# Patient Record
Sex: Male | Born: 1995 | Race: Black or African American | Hispanic: No | Marital: Single | Smoking: Current every day smoker
Health system: Southern US, Community
[De-identification: ages and names within clinical notes are randomized; demographics above are authoritative.]

## PROBLEM LIST (undated history)

## (undated) DIAGNOSIS — J45909 Unspecified asthma, uncomplicated: Secondary | ICD-10-CM

---
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
## 2016-12-02 ENCOUNTER — Observation Stay
Admission: EM | Admit: 2016-12-02 | Discharge: 2016-12-03 | Disposition: A | Discharge status: Home / Self Care | Payer: Medicaid Other | Attending: Surgery | Admitting: Surgery

## 2016-12-02 ENCOUNTER — Encounter
Admission: EM | Disposition: A | Discharge status: Home / Self Care | Payer: Self-pay | Source: Home / Self Care | Attending: Emergency Medicine

## 2016-12-02 ENCOUNTER — Other Ambulatory Visit: Payer: Self-pay

## 2016-12-02 ENCOUNTER — Emergency Department: Payer: Medicaid Other

## 2016-12-02 ENCOUNTER — Observation Stay: Payer: Medicaid Other | Admitting: Anesthesiology

## 2016-12-02 ENCOUNTER — Encounter: Payer: Self-pay | Admitting: Emergency Medicine

## 2016-12-02 DIAGNOSIS — Z79899 Other long term (current) drug therapy: Secondary | ICD-10-CM | POA: Insufficient documentation

## 2016-12-02 DIAGNOSIS — J45909 Unspecified asthma, uncomplicated: Secondary | ICD-10-CM | POA: Insufficient documentation

## 2016-12-02 DIAGNOSIS — K3580 Unspecified acute appendicitis: Secondary | ICD-10-CM | POA: Diagnosis not present

## 2016-12-02 DIAGNOSIS — Z7951 Long term (current) use of inhaled steroids: Secondary | ICD-10-CM | POA: Diagnosis not present

## 2016-12-02 DIAGNOSIS — K3533 Acute appendicitis with perforation and localized peritonitis, with abscess: Secondary | ICD-10-CM | POA: Diagnosis not present

## 2016-12-02 DIAGNOSIS — K3589 Other acute appendicitis without perforation or gangrene: Secondary | ICD-10-CM

## 2016-12-02 DIAGNOSIS — K353 Acute appendicitis with localized peritonitis, without perforation or gangrene: Secondary | ICD-10-CM

## 2016-12-02 DIAGNOSIS — R1084 Generalized abdominal pain: Secondary | ICD-10-CM

## 2016-12-02 DIAGNOSIS — R109 Unspecified abdominal pain: Secondary | ICD-10-CM | POA: Diagnosis present

## 2016-12-02 HISTORY — PX: LAPAROSCOPIC APPENDECTOMY: SHX408

## 2016-12-02 LAB — COMPREHENSIVE METABOLIC PANEL
ALT: 19 U/L (ref 17–63)
AST: 22 U/L (ref 15–41)
Albumin: 4.9 g/dL (ref 3.5–5.0)
Alkaline Phosphatase: 117 U/L (ref 38–126)
Anion gap: 11 (ref 5–15)
BILIRUBIN TOTAL: 0.9 mg/dL (ref 0.3–1.2)
BUN: 13 mg/dL (ref 6–20)
CHLORIDE: 105 mmol/L (ref 101–111)
CO2: 20 mmol/L — ABNORMAL LOW (ref 22–32)
CREATININE: 0.97 mg/dL (ref 0.61–1.24)
Calcium: 10 mg/dL (ref 8.9–10.3)
Glucose, Bld: 126 mg/dL — ABNORMAL HIGH (ref 65–99)
POTASSIUM: 3.5 mmol/L (ref 3.5–5.1)
Sodium: 136 mmol/L (ref 135–145)
TOTAL PROTEIN: 8.3 g/dL — AB (ref 6.5–8.1)

## 2016-12-02 LAB — CBC WITH DIFFERENTIAL/PLATELET
BASOS ABS: 0 10*3/uL (ref 0–0.1)
BASOS PCT: 0 %
EOS ABS: 0 10*3/uL (ref 0–0.7)
EOS PCT: 0 %
HCT: 45.5 % (ref 40.0–52.0)
Hemoglobin: 15.1 g/dL (ref 13.0–18.0)
LYMPHS PCT: 10 %
Lymphs Abs: 1.3 10*3/uL (ref 1.0–3.6)
MCH: 29.4 pg (ref 26.0–34.0)
MCHC: 33.2 g/dL (ref 32.0–36.0)
MCV: 88.8 fL (ref 80.0–100.0)
MONO ABS: 0.4 10*3/uL (ref 0.2–1.0)
Monocytes Relative: 3 %
Neutro Abs: 10.9 10*3/uL — ABNORMAL HIGH (ref 1.4–6.5)
Neutrophils Relative %: 87 %
PLATELETS: 160 10*3/uL (ref 150–440)
RBC: 5.12 MIL/uL (ref 4.40–5.90)
RDW: 13.2 % (ref 11.5–14.5)
WBC: 12.7 10*3/uL — AB (ref 3.8–10.6)

## 2016-12-02 LAB — URINALYSIS, COMPLETE (UACMP) WITH MICROSCOPIC
BILIRUBIN URINE: NEGATIVE
Bacteria, UA: NONE SEEN
GLUCOSE, UA: NEGATIVE mg/dL
HGB URINE DIPSTICK: NEGATIVE
KETONES UR: 80 mg/dL — AB
LEUKOCYTES UA: NEGATIVE
NITRITE: NEGATIVE
PH: 6 (ref 5.0–8.0)
Protein, ur: NEGATIVE mg/dL

## 2016-12-02 LAB — URINE DRUG SCREEN, QUALITATIVE (ARMC ONLY)
Amphetamines, Ur Screen: NOT DETECTED
BARBITURATES, UR SCREEN: NOT DETECTED
BENZODIAZEPINE, UR SCRN: NOT DETECTED
COCAINE METABOLITE, UR ~~LOC~~: NOT DETECTED
Cannabinoid 50 Ng, Ur ~~LOC~~: POSITIVE — AB
MDMA (Ecstasy)Ur Screen: NOT DETECTED
METHADONE SCREEN, URINE: NOT DETECTED
OPIATE, UR SCREEN: NOT DETECTED
PHENCYCLIDINE (PCP) UR S: NOT DETECTED
Tricyclic, Ur Screen: NOT DETECTED

## 2016-12-02 LAB — MRSA PCR SCREENING: MRSA BY PCR: NEGATIVE

## 2016-12-02 LAB — ETHANOL: Alcohol, Ethyl (B): <10 mg/dL (ref <10)

## 2016-12-02 LAB — LIPASE, BLOOD: LIPASE: 21 U/L (ref 11–51)

## 2016-12-02 SURGERY — APPENDECTOMY, LAPAROSCOPIC
Anesthesia: General | Site: Abdomen | Wound class: Clean Contaminated

## 2016-12-02 MED ORDER — MIDAZOLAM HCL 2 MG/2ML IJ SOLN
INTRAMUSCULAR | Status: AC
  Filled 2016-12-02: qty 2

## 2016-12-02 MED ORDER — LIDOCAINE HCL 1 % IJ SOLN
INTRAMUSCULAR | Status: DC | PRN
  Administered 2016-12-02: 19 mL via INTRADERMAL

## 2016-12-02 MED ORDER — LACTATED RINGERS IV SOLN
INTRAVENOUS | Status: DC | PRN
  Administered 2016-12-02: 22:00:00 via INTRAVENOUS

## 2016-12-02 MED ORDER — IOPAMIDOL (ISOVUE-300) INJECTION 61%
100.0000 mL | Freq: Once | INTRAVENOUS | Status: AC | PRN
  Administered 2016-12-02: 100 mL via INTRAVENOUS

## 2016-12-02 MED ORDER — DEXAMETHASONE SODIUM PHOSPHATE 10 MG/ML IJ SOLN
INTRAMUSCULAR | Status: AC
  Filled 2016-12-02: qty 1

## 2016-12-02 MED ORDER — DEXAMETHASONE SODIUM PHOSPHATE 10 MG/ML IJ SOLN
INTRAMUSCULAR | Status: DC | PRN
  Administered 2016-12-02: 10 mg via INTRAVENOUS

## 2016-12-02 MED ORDER — LACTATED RINGERS IV SOLN
INTRAVENOUS | Status: DC
  Administered 2016-12-02: 17:00:00 via INTRAVENOUS

## 2016-12-02 MED ORDER — FENTANYL CITRATE (PF) 100 MCG/2ML IJ SOLN: 25.0000 ug | INTRAMUSCULAR | Status: DC | PRN

## 2016-12-02 MED ORDER — ONDANSETRON HCL 4 MG/2ML IJ SOLN
INTRAMUSCULAR | Status: AC
Start: 2016-12-02 — End: ?
  Filled 2016-12-02: qty 2

## 2016-12-02 MED ORDER — ONDANSETRON HCL 4 MG PO TABS: 4.0000 mg | ORAL_TABLET | Freq: Four times a day (QID) | ORAL | Status: DC | PRN

## 2016-12-02 MED ORDER — HEPARIN SODIUM (PORCINE) 5000 UNIT/ML IJ SOLN
5000.0000 [IU] | Freq: Three times a day (TID) | INTRAMUSCULAR | Status: DC
  Administered 2016-12-02 – 2016-12-03 (×3): 5000 [IU] via SUBCUTANEOUS
  Filled 2016-12-02 (×3): qty 1

## 2016-12-02 MED ORDER — PROMETHAZINE HCL 25 MG/ML IJ SOLN
25.0000 mg | Freq: Once | INTRAMUSCULAR | Status: AC
  Administered 2016-12-02: 25 mg via INTRAVENOUS
  Filled 2016-12-02: qty 1

## 2016-12-02 MED ORDER — FENTANYL CITRATE (PF) 100 MCG/2ML IJ SOLN
INTRAMUSCULAR | Status: DC | PRN
  Administered 2016-12-02: 150 ug via INTRAVENOUS
  Administered 2016-12-02: 100 ug via INTRAVENOUS

## 2016-12-02 MED ORDER — ONDANSETRON HCL 4 MG/2ML IJ SOLN
4.0000 mg | Freq: Once | INTRAMUSCULAR | Status: AC
  Administered 2016-12-02: 4 mg via INTRAVENOUS
  Filled 2016-12-02: qty 2

## 2016-12-02 MED ORDER — SUGAMMADEX SODIUM 200 MG/2ML IV SOLN
INTRAVENOUS | Status: AC
  Filled 2016-12-02: qty 2

## 2016-12-02 MED ORDER — ROCURONIUM BROMIDE 50 MG/5ML IV SOLN
INTRAVENOUS | Status: AC
  Filled 2016-12-02: qty 1

## 2016-12-02 MED ORDER — MIDAZOLAM HCL 2 MG/2ML IJ SOLN
INTRAMUSCULAR | Status: DC | PRN
Start: 2016-12-02 — End: 2016-12-02
  Administered 2016-12-02: 2 mg via INTRAVENOUS

## 2016-12-02 MED ORDER — PROPOFOL 10 MG/ML IV BOLUS
INTRAVENOUS | Status: DC | PRN
  Administered 2016-12-02: 180 mg via INTRAVENOUS

## 2016-12-02 MED ORDER — FENTANYL CITRATE (PF) 250 MCG/5ML IJ SOLN
INTRAMUSCULAR | Status: AC
  Filled 2016-12-02: qty 5

## 2016-12-02 MED ORDER — SUGAMMADEX SODIUM 200 MG/2ML IV SOLN
INTRAVENOUS | Status: DC | PRN
  Administered 2016-12-02: 200 mg via INTRAVENOUS

## 2016-12-02 MED ORDER — ONDANSETRON HCL 4 MG/2ML IJ SOLN
INTRAMUSCULAR | Status: DC | PRN
  Administered 2016-12-02: 4 mg via INTRAVENOUS

## 2016-12-02 MED ORDER — PIPERACILLIN-TAZOBACTAM 3.375 G IVPB 30 MIN
3.3750 g | Freq: Once | INTRAVENOUS | Status: AC
  Administered 2016-12-02: 3.375 g via INTRAVENOUS
  Filled 2016-12-02: qty 50

## 2016-12-02 MED ORDER — PIPERACILLIN-TAZOBACTAM 3.375 G IVPB
3.3750 g | Freq: Three times a day (TID) | INTRAVENOUS | Status: DC
  Administered 2016-12-02: 3.375 g via INTRAVENOUS
  Filled 2016-12-02: qty 50

## 2016-12-02 MED ORDER — KETOROLAC TROMETHAMINE 30 MG/ML IJ SOLN
15.0000 mg | INTRAMUSCULAR | Status: AC
  Administered 2016-12-02: 15 mg via INTRAVENOUS
  Filled 2016-12-02: qty 1

## 2016-12-02 MED ORDER — MORPHINE SULFATE (PF) 2 MG/ML IV SOLN
2.0000 mg | INTRAVENOUS | Status: DC | PRN
  Administered 2016-12-02: 2 mg via INTRAVENOUS
  Filled 2016-12-02: qty 1

## 2016-12-02 MED ORDER — SODIUM CHLORIDE 0.9 % IV BOLUS (SEPSIS)
1000.0000 mL | Freq: Once | INTRAVENOUS | Status: AC
Start: 2016-12-02 — End: 2016-12-02
  Administered 2016-12-02: 1000 mL via INTRAVENOUS

## 2016-12-02 MED ORDER — ONDANSETRON HCL 4 MG/2ML IJ SOLN
4.0000 mg | Freq: Four times a day (QID) | INTRAMUSCULAR | Status: DC | PRN
  Administered 2016-12-02: 4 mg via INTRAVENOUS
  Filled 2016-12-02: qty 2

## 2016-12-02 MED ORDER — ROCURONIUM BROMIDE 100 MG/10ML IV SOLN
INTRAVENOUS | Status: DC | PRN
Start: 2016-12-02 — End: 2016-12-02
  Administered 2016-12-02: 10 mg via INTRAVENOUS
  Administered 2016-12-02: 40 mg via INTRAVENOUS

## 2016-12-02 MED ORDER — ONDANSETRON HCL 4 MG/2ML IJ SOLN: 4.0000 mg | Freq: Once | INTRAMUSCULAR | Status: DC | PRN

## 2016-12-02 SURGICAL SUPPLY — 38 items
BLADE SURG SZ11 CARB STEEL (BLADE) ×3 IMPLANT
CANISTER SUCT 3000ML PPV (MISCELLANEOUS) ×3 IMPLANT
CHLORAPREP W/TINT 26ML (MISCELLANEOUS) ×6 IMPLANT
CUTTER FLEX LINEAR 45M (STAPLE) ×3 IMPLANT
DECANTER SPIKE VIAL GLASS SM (MISCELLANEOUS) ×3 IMPLANT
DERMABOND ADVANCED (GAUZE/BANDAGES/DRESSINGS) ×2
DERMABOND ADVANCED .7 DNX12 (GAUZE/BANDAGES/DRESSINGS) ×1 IMPLANT
ELECT REM PT RETURN 9FT ADLT (ELECTROSURGICAL) ×3
ELECTRODE REM PT RTRN 9FT ADLT (ELECTROSURGICAL) ×1 IMPLANT
GLOVE BIO SURGEON STRL SZ7 (GLOVE) ×12 IMPLANT
GLOVE BIOGEL PI IND STRL 7.5 (GLOVE) ×2 IMPLANT
GLOVE BIOGEL PI INDICATOR 7.5 (GLOVE) ×4
GOWN STRL REUS W/ TWL LRG LVL4 (GOWN DISPOSABLE) ×1 IMPLANT
GOWN STRL REUS W/ TWL XL LVL3 (GOWN DISPOSABLE) ×1 IMPLANT
GOWN STRL REUS W/TWL LRG LVL4 (GOWN DISPOSABLE) ×2
GOWN STRL REUS W/TWL XL LVL3 (GOWN DISPOSABLE) ×2
GRASPER SUT TROCAR 14GX15 (MISCELLANEOUS) ×3 IMPLANT
IRRIGATION STRYKERFLOW (MISCELLANEOUS) ×1 IMPLANT
IRRIGATOR STRYKERFLOW (MISCELLANEOUS) ×3
KIT RM TURNOVER STRD PROC AR (KITS) ×3 IMPLANT
NEEDLE HYPO 22GX1.5 SAFETY (NEEDLE) ×3 IMPLANT
NEEDLE INSUFFLATION 14GA 120MM (NEEDLE) ×3 IMPLANT
NS IRRIG 1000ML POUR BTL (IV SOLUTION) ×3 IMPLANT
PACK LAP CHOLECYSTECTOMY (MISCELLANEOUS) ×3 IMPLANT
POUCH SPECIMEN RETRIEVAL 10MM (ENDOMECHANICALS) ×6 IMPLANT
RELOAD 45 VASCULAR/THIN (ENDOMECHANICALS) IMPLANT
RELOAD STAPLE TA45 3.5 REG BLU (ENDOMECHANICALS) ×3 IMPLANT
SHEARS HARMONIC ACE PLUS 36CM (ENDOMECHANICALS) ×3 IMPLANT
SLEEVE ENDOPATH XCEL 5M (ENDOMECHANICALS) ×3 IMPLANT
SOL .9 NS 3000ML IRR  AL (IV SOLUTION) ×2
SOL .9 NS 3000ML IRR UROMATIC (IV SOLUTION) ×1 IMPLANT
SUT MNCRL AB 4-0 PS2 18 (SUTURE) ×3 IMPLANT
SUT VICRYL 0 UR6 27IN ABS (SUTURE) ×3 IMPLANT
SUT VICRYL AB 3-0 FS1 BRD 27IN (SUTURE) ×3 IMPLANT
TRAY FOLEY W/METER SILVER 16FR (SET/KITS/TRAYS/PACK) ×3 IMPLANT
TROCAR XCEL 12X100 BLDLESS (ENDOMECHANICALS) ×3 IMPLANT
TROCAR XCEL NON-BLD 5MMX100MML (ENDOMECHANICALS) ×3 IMPLANT
TUBING INSUFFLATION (TUBING) ×3 IMPLANT

## 2016-12-02 NOTE — Progress Notes (Signed)
ANTIBIOTIC CONSULT NOTE - INITIAL  Pharmacy Consult for Zosyn  Indication: intra-abdominal   No Known Allergies  Patient Measurements: Height: 5\' 11"  (180.3 cm) Weight: 218 lb (98.9 kg) IBW/kg (Calculated) : 75.3 Adjusted Body Weight:   Vital Signs: Temp: 99 F (37.2 C) (11/08 2113) Temp Source: Oral (11/08 2113) BP: 119/65 (11/08 2113) Pulse Rate: 77 (11/08 2113) Intake/Output from previous day: No intake/output data recorded. Intake/Output from this shift: Total I/O In: 287.7 [I.V.:287.7] Out: 0   Labs: Recent Labs    12/02/16 1051  WBC 12.7*  HGB 15.1  PLT 160  CREATININE 0.97   Estimated Creatinine Clearance: 145.5 mL/min (by C-G formula based on SCr of 0.97 mg/dL). No results for input(s): VANCOTROUGH, VANCOPEAK, VANCORANDOM, GENTTROUGH, GENTPEAK, GENTRANDOM, TOBRATROUGH, TOBRAPEAK, TOBRARND, AMIKACINPEAK, AMIKACINTROU, AMIKACIN in the last 72 hours.   Microbiology: Recent Results (from the past 720 hour(s))  MRSA PCR Screening     Status: None   Collection Time: 12/02/16  4:49 PM  Result Value Ref Range Status   MRSA by PCR NEGATIVE NEGATIVE Final    Comment:        The GeneXpert MRSA Assay (FDA approved for NASAL specimens only), is one component of a comprehensive MRSA colonization surveillance program. It is not intended to diagnose MRSA infection nor to guide or monitor treatment for MRSA infections.     Medical History: Past Medical History:  Diagnosis Date  . Asthma     Medications:  Medications Prior to Admission  Medication Sig Dispense Refill Last Dose  . albuterol (PROAIR HFA) 108 (90 Base) MCG/ACT inhaler Inhale 2 Inhalers as needed into the lungs.   prn at prn  . budesonide-formoterol (SYMBICORT) 160-4.5 MCG/ACT inhaler Inhale 2 puffs daily into the lungs.   12/02/2016 at Unknown time  . cetirizine (ZYRTEC) 10 MG tablet Take 1 tablet daily by mouth.   prn at prn  . montelukast (SINGULAIR) 10 MG tablet Take 1 tablet daily by mouth.    prn at prn   Assessment: CrCl = 145.5 ml/min   Goal of Therapy:  resolution of infection  Plan:  Will start Zosyn 3.375 gm IV Q8H EI to start 11/8 @ 22:00.   Xayvion Shirah D 12/02/2016,9:38 PM

## 2016-12-02 NOTE — Transfer of Care (Signed)
Immediate Anesthesia Transfer of Care Note  Patient: Andre GardenerAkei D [place]  Procedure(s) Performed: APPENDECTOMY LAPAROSCOPIC (N/A Abdomen)  Patient Location: PACU  Anesthesia Type:General  Level of Consciousness: sedated and responds to stimulation  Airway & Oxygen Therapy: Patient Spontanous Breathing and Patient connected to face mask oxygen  Post-op Assessment: Report given to RN and Post -op Vital signs reviewed and stable  Post vital signs: Reviewed and stable  Last Vitals:  Vitals:   12/02/16 2113 12/02/16 2352  BP: 119/65 (!) 115/45  Pulse: 77 96  Resp: 18 18  Temp: 37.2 C 37.1 C  SpO2: 100% 95%    Last Pain:  Vitals:   12/02/16 2130  TempSrc:   PainSc: 5       Patients Stated Pain Goal: 0 (12/02/16 1527)  Complications: No apparent anesthesia complications

## 2016-12-02 NOTE — Anesthesia Preprocedure Evaluation (Signed)
Anesthesia Evaluation  Patient identified by MRN, date of birth, ID band Patient awake    Reviewed: Allergy & Precautions, NPO status , Patient's Chart, lab work & pertinent test results  Airway Mallampati: II  TM Distance: >3 FB     Dental  (+) Teeth Intact   Pulmonary asthma ,    Pulmonary exam normal        Cardiovascular negative cardio ROS Normal cardiovascular exam     Neuro/Psych negative neurological ROS  negative psych ROS   GI/Hepatic Neg liver ROS, (+)     substance abuse  marijuana use, Acute appendix   Endo/Other  negative endocrine ROS  Renal/GU negative Renal ROS  negative genitourinary   Musculoskeletal negative musculoskeletal ROS (+)   Abdominal Normal abdominal exam  (+)   Peds negative pediatric ROS (+)  Hematology negative hematology ROS (+)   Anesthesia Other Findings   Reproductive/Obstetrics                             Anesthesia Physical Anesthesia Plan  ASA: II and emergent  Anesthesia Plan: General   Post-op Pain Management:    Induction: Intravenous, Rapid sequence and Cricoid pressure planned  PONV Risk Score and Plan:   Airway Management Planned: Oral ETT  Additional Equipment:   Intra-op Plan:   Post-operative Plan: Extubation in OR  Informed Consent: I have reviewed the patients History and Physical, chart, labs and discussed the procedure including the risks, benefits and alternatives for the proposed anesthesia with the patient or authorized representative who has indicated his/her understanding and acceptance.   Dental advisory given  Plan Discussed with: CRNA and Surgeon  Anesthesia Plan Comments:         Anesthesia Quick Evaluation

## 2016-12-02 NOTE — Anesthesia Procedure Notes (Signed)
Procedure Name: Intubation Date/Time: 12/02/2016 10:27 PM Performed by: Jonna Clark, CRNA Pre-anesthesia Checklist: Patient identified, Patient being monitored, Timeout performed, Emergency Drugs available and Suction available Patient Re-evaluated:Patient Re-evaluated prior to induction Oxygen Delivery Method: Circle system utilized Preoxygenation: Pre-oxygenation with 100% oxygen Induction Type: IV induction Ventilation: Mask ventilation without difficulty Laryngoscope Size: Mac and 3 Grade View: Grade I Tube type: Oral Tube size: 7.5 mm Number of attempts: 1 Airway Equipment and Method: Stylet Placement Confirmation: ETT inserted through vocal cords under direct vision,  positive ETCO2 and breath sounds checked- equal and bilateral Secured at: 21 cm Tube secured with: Tape Dental Injury: Teeth and Oropharynx as per pre-operative assessment

## 2016-12-02 NOTE — ED Notes (Signed)
Pt here for abdominal pain from [facility]. EMS reports [facility] told them vomiting but have not seen him yet.

## 2016-12-02 NOTE — Anesthesia Post-op Follow-up Note (Signed)
Anesthesia QCDR form completed.        

## 2016-12-02 NOTE — ED Provider Notes (Signed)
[facility] Emergency Department Provider Note  ____________________________________________  Time seen: Approximately 3:08 PM  I have reviewed the triage vital signs and the nursing notes.   HISTORY  Chief Complaint Abdominal Pain    HPI Andre Howard is a 21 y.o. male  Who complains of rapid onset of abdominal pain since early this morning. Generalized, nonradiating. No aggravating or alleviating factors. rated as severe. Sharp.    Past Medical History:  Diagnosis Date  . Asthma      Patient Active Problem List   Diagnosis Date Noted  . Acute appendicitis 12/02/2016     History reviewed. No pertinent surgical history.   Prior to Admission medications   Medication Sig Start Date End Date Taking? Authorizing Provider  albuterol (PROAIR HFA) 108 (90 Base) MCG/ACT inhaler Inhale 2 Inhalers as needed into the lungs. 09/16/14  Yes [provider]  budesonide-formoterol (SYMBICORT) 160-4.5 MCG/ACT inhaler Inhale 2 puffs daily into the lungs. 03/09/16  Yes [provider]  cetirizine (ZYRTEC) 10 MG tablet Take 1 tablet daily by mouth. 09/08/16  Yes [provider]  montelukast (SINGULAIR) 10 MG tablet Take 1 tablet daily by mouth. 03/09/16  Yes [provider]     Allergies Patient has no known allergies.   History reviewed. No pertinent family history.  Social History Social History   Tobacco Use  . Smoking status: Never Smoker  . Smokeless tobacco: Never Used  Substance Use Topics  . Alcohol use: No  . Drug use: No    Review of Systems  Constitutional:   No fever or chills.  ENT:   No sore throat. No rhinorrhea. Cardiovascular:   No chest pain or syncope. Respiratory:   No dyspnea or cough. Gastrointestinal:    Positive abdominal pain and vomiting. No constipation.  Musculoskeletal:   Negative for focal pain or swelling All other systems reviewed and are negative except as documented above in ROS  and HPI.  ____________________________________________   PHYSICAL EXAM:  VITAL SIGNS: ED Triage Vitals  Enc Vitals Group     BP 12/02/16 1001 133/81     Pulse Rate 12/02/16 1001 77     Resp 12/02/16 1001 (!) 22     Temp 12/02/16 1001 (!) 97.5 F (36.4 C)     Temp Source 12/02/16 1001 Oral     SpO2 12/02/16 1001 100 %     Weight 12/02/16 0959 255 lb (115.7 kg)     Height 12/02/16 0959 5\' 11"  (1.803 m)     Head Circumference --      Peak Flow --      Pain Score 12/02/16 0958 10     Pain Loc --      Pain Edu? --      Excl. in GC? --     Vital signs reviewed, nursing assessments reviewed.   Constitutional:   Alert and oriented. Well appearing and in no distress. Eyes:   No scleral icterus.  EOMI. No nystagmus. No conjunctival pallor. PERRL. ENT   Head:   Normocephalic and atraumatic.   Nose:   No congestion/rhinnorhea.    Mouth/Throat:   MMM, no pharyngeal erythema. No peritonsillar mass.    Neck:   No meningismus. Full ROM. Hematological/Lymphatic/Immunilogical:   No cervical lymphadenopathy. Cardiovascular:   RRR. Symmetric bilateral radial and DP pulses.  No murmurs.  Respiratory:   Normal respiratory effort without tachypnea/retractions. Breath sounds are clear and equal bilaterally. No wheezes/rales/rhonchi. Gastrointestinal:   Soft  with  diffuse tenderness, worse in the right lower quadrant. Non distended. There is no CVA tenderness.  No rebound, rigidity, or guarding. Genitourinary:   deferred Musculoskeletal:   Normal range of motion in all extremities. No joint effusions.  No lower extremity tenderness.  No edema. Neurologic:   Normal speech and language.  Motor grossly intact. No gross focal neurologic deficits are appreciated.  Skin:    Skin is warm, dry and intact. No rash noted.  No petechiae, purpura, or bullae.  ____________________________________________    LABS (pertinent positives/negatives) (all labs ordered are listed, but only  abnormal results are displayed) Labs Reviewed  COMPREHENSIVE METABOLIC PANEL - Abnormal; Notable for the following components:      Result Value   CO2 20 (*)    Glucose, Bld 126 (*)    Total Protein 8.3 (*)    All other components within normal limits  CBC WITH DIFFERENTIAL/PLATELET - Abnormal; Notable for the following components:   WBC 12.7 (*)    Neutro Abs 10.9 (*)    All other components within normal limits  URINALYSIS, COMPLETE (UACMP) WITH MICROSCOPIC - Abnormal; Notable for the following components:   Color, Urine YELLOW (*)    APPearance CLEAR (*)    Specific Gravity, Urine >1.046 (*)    Ketones, ur 80 (*)    Squamous Epithelial / LPF 0-5 (*)    All other components within normal limits  LIPASE, BLOOD  ETHANOL  URINE DRUG SCREEN, QUALITATIVE ([facility] ONLY)  HIV ANTIBODY (ROUTINE TESTING)  CBC  CREATININE, SERUM   ____________________________________________   EKG    ____________________________________________    RADIOLOGY  Ct Abdomen Pelvis W Contrast  Result Date: 12/02/2016 CLINICAL DATA:  RIGHT lower abdominal pain with nausea and vomiting EXAM: CT ABDOMEN AND PELVIS WITH CONTRAST TECHNIQUE: Multidetector CT imaging of the abdomen and pelvis was performed using the standard protocol following bolus administration of intravenous contrast. Sagittal and coronal MPR images reconstructed from axial data set. CONTRAST:  100mL ISOVUE-300 IOPAMIDOL (ISOVUE-300) INJECTION 61% IV. No dilute oral contrast. COMPARISON:  None FINDINGS: Lower chest: Lung bases clear Hepatobiliary: Gallbladder and liver normal appearance Pancreas: Normal appearance Spleen: Normal appearance Adrenals/Urinary Tract: Adrenal glands, kidneys, ureters, and bladder normal appearance Stomach/Bowel: Stomach and bowel loops grossly unremarkable for exam lacking GI contrast. Appendix: Location: Posterior and slightly lateral to cecum Diameter: Proximally normal caliber, distally dilated up to 12 mm  Appendicolith: Small appendicoliths up to 5 mm diameter versus inspissated material Mucosal hyper-enhancement: Absent Extraluminal gas: None Periappendiceal collection: Periappendiceal edema/infiltration without discrete abscess collection Vascular/Lymphatic: Unremarkable Reproductive: Prostate gland and seminal vesicles unremarkable Other: Small amount of nonspecific free pelvic fluid.  No free air. Musculoskeletal: Bone islands at LEFT femoral neck and intertrochanteric region IMPRESSION: Acute appendicitis. Electronically Signed   By: Ulyses SouthwardMark  Boles M.D.   On: 12/02/2016 12:52    ____________________________________________   PROCEDURES Procedures  ____________________________________________   DIFFERENTIAL DIAGNOSIS   appendicitis, kidney stone, colitis, bowel obstruction, hernia  CLINICAL IMPRESSION / ASSESSMENT AND PLAN / ED COURSE  Pertinent labs & imaging results that were available during my care of the patient were reviewed by me and considered in my medical decision making (see chart for details).    patient presents with severe abdominal pain. Vital signs unremarkable, check labs, CT abdomen and pelvis due to severe pain and concerning exam.  Clinical Course as of Dec 03 1506  Thu Dec 02, 2016  1425 CT reviewed, positive for acute appy. D/w [facility] Seltzerooper. Zosyn for now. Family  updated. Pt sleeping comfortably.   [PS]    Clinical Course User Index [PS] Sharman CheekStafford, Victor Langenbach, MD     ----------------------------------------- 3:11 PM on 12/02/2016 -----------------------------------------   Evaluated by Excell Seltzerooper in the ED. Plan to admit.  ____________________________________________   FINAL CLINICAL IMPRESSION(S) / ED DIAGNOSES    Final diagnoses:  Generalized abdominal pain  Other acute appendicitis      This SmartLink is deprecated. Use AVSMEDLIST instead to display the medication list for a patient.   Portions of this note were generated with dragon dictation  software. Dictation errors may occur despite best attempts at proofreading.    Sharman CheekStafford, Nivea Wojdyla, MD 12/02/16 [phone]

## 2016-12-02 NOTE — H&P (Signed)
Andre Howard is an 21 y.o. male.    Chief Complaint: Right lower quadrant pain  HPI: This patient with right lower quadrant pain that started earlier today.  He is never had an episode like this before.  He has had nausea and multiple emesis but normal bowel movements no melena or hematochezia.  He denies fevers or chills.  He is here with multiple family members.  Workup in the emergency room suggested acute appendicitis.  Past Medical History:  Diagnosis Date  . Asthma     History reviewed. No pertinent surgical history.  History reviewed. No pertinent family history. Social History:  reports that  has never smoked. he has never used smokeless tobacco. He reports that he does not drink alcohol or use drugs. No family history of appendicitis allergies: No Known Allergies   (Not in a hospital admission)   Review of Systems  Constitutional: Negative.   HENT: Negative.   Eyes: Negative.   Respiratory: Negative.   Cardiovascular: Negative.   Gastrointestinal: Positive for abdominal pain, nausea and vomiting. Negative for blood in stool, constipation, diarrhea and heartburn.  Genitourinary: Negative.   Musculoskeletal: Negative.   Skin: Negative.   Neurological: Negative.   Endo/Heme/Allergies: Negative.   Psychiatric/Behavioral: Negative.      Physical Exam:  BP 119/72 (BP Location: Left Arm)   Pulse (!) 58   Temp 98.5 F (36.9 C) (Oral)   Resp 19   Ht _0  (1.803 m)   Wt 255 lb (115.7 kg)   SpO2 100%   BMI 35.57 kg/m   Physical Exam  Constitutional: He is oriented to person, place, and time and well-developed, well-nourished, and in no distress. No distress.  HENT:  Head: Normocephalic and atraumatic.  Eyes: Pupils are equal, round, and reactive to light. Right eye exhibits no discharge. Left eye exhibits no discharge. No scleral icterus.  Neck: Normal range of motion.  Cardiovascular: Normal rate, regular rhythm and normal heart sounds.  Pulmonary/Chest:  Effort normal and breath sounds normal. No respiratory distress. He has no wheezes. He has no rales.  Abdominal: Soft. He exhibits no distension. There is tenderness. There is guarding. There is no rebound.  Tenderness maximal at McBurney's point with a questionable Rovsing sign with minimal guarding no rebound  Lymphadenopathy:    He has no cervical adenopathy.  Neurological: He is alert and oriented to person, place, and time.  Skin: Skin is warm and dry. He is not diaphoretic. No erythema.  Psychiatric: Mood and affect normal.  Vitals reviewed.       Results for orders placed or performed during the hospital encounter of 12/02/16 (from the past 48 hour(s))  Comprehensive metabolic panel     Status: Abnormal   Collection Time: 12/02/16 10:51 AM  Result Value Ref Range   Sodium 136 135 - 145 mmol/L   Potassium 3.5 3.5 - 5.1 mmol/L   Chloride 105 101 - 111 mmol/L   CO2 20 (L) 22 - 32 mmol/L   Glucose, Bld 126 (H) 65 - 99 mg/dL   BUN 13 6 - 20 mg/dL   Creatinine, Ser 0.97 0.61 - 1.24 mg/dL   Calcium 10.0 8.9 - 10.3 mg/dL   Total Protein 8.3 (H) 6.5 - 8.1 g/dL   Albumin 4.9 3.5 - 5.0 g/dL   AST 22 15 - 41 U/L   ALT 19 17 - 63 U/L   Alkaline Phosphatase 117 38 - 126 U/L   Total Bilirubin 0.9 0.3 - 1.2 mg/dL  GFR calc non Af Amer >60 >60 mL/min   GFR calc Af Amer >60 >60 mL/min    Comment: (NOTE) The eGFR has been calculated using the CKD EPI equation. This calculation has not been validated in all clinical situations. eGFR's persistently <60 mL/min signify possible Chronic Kidney Disease.    Anion gap 11 5 - 15  Lipase, blood     Status: None   Collection Time: 12/02/16 10:51 AM  Result Value Ref Range   Lipase 21 11 - 51 U/L  CBC with Differential     Status: Abnormal   Collection Time: 12/02/16 10:51 AM  Result Value Ref Range   WBC 12.7 (H) 3.8 - 10.6 K/uL   RBC 5.12 4.40 - 5.90 MIL/uL   Hemoglobin 15.1 13.0 - 18.0 g/dL   HCT 45.5 40.0 - 52.0 %   MCV 88.8 80.0 -  100.0 fL   MCH 29.4 26.0 - 34.0 pg   MCHC 33.2 32.0 - 36.0 g/dL   RDW 13.2 11.5 - 14.5 %   Platelets 160 150 - 440 K/uL   Neutrophils Relative % 87 %   Neutro Abs 10.9 (H) 1.4 - 6.5 K/uL   Lymphocytes Relative 10 %   Lymphs Abs 1.3 1.0 - 3.6 K/uL   Monocytes Relative 3 %   Monocytes Absolute 0.4 0.2 - 1.0 K/uL   Eosinophils Relative 0 %   Eosinophils Absolute 0.0 0 - 0.7 K/uL   Basophils Relative 0 %   Basophils Absolute 0.0 0 - 0.1 K/uL  Ethanol     Status: None   Collection Time: 12/02/16 10:51 AM  Result Value Ref Range   Alcohol, Ethyl (B) <10 <10 mg/dL    Comment:        LOWEST DETECTABLE LIMIT FOR SERUM ALCOHOL IS 10 mg/dL FOR MEDICAL PURPOSES ONLY   Urinalysis, Complete w Microscopic     Status: Abnormal   Collection Time: 12/02/16  2:43 PM  Result Value Ref Range   Color, Urine YELLOW (A) YELLOW   APPearance CLEAR (A) CLEAR   Specific Gravity, Urine >1.046 (H) 1.005 - 1.030   pH 6.0 5.0 - 8.0   Glucose, UA NEGATIVE NEGATIVE mg/dL   Hgb urine dipstick NEGATIVE NEGATIVE   Bilirubin Urine NEGATIVE NEGATIVE   Ketones, ur 80 (A) NEGATIVE mg/dL   Protein, ur NEGATIVE NEGATIVE mg/dL   Nitrite NEGATIVE NEGATIVE   Leukocytes, UA NEGATIVE NEGATIVE   RBC / HPF 0-5 0 - 5 RBC/hpf   WBC, UA 0-5 0 - 5 WBC/hpf   Bacteria, UA NONE SEEN NONE SEEN   Squamous Epithelial / LPF 0-5 (A) NONE SEEN   Mucus PRESENT    Ct Abdomen Pelvis W Contrast  Result Date: 12/02/2016 CLINICAL DATA:  RIGHT lower abdominal pain with nausea and vomiting EXAM: CT ABDOMEN AND PELVIS WITH CONTRAST TECHNIQUE: Multidetector CT imaging of the abdomen and pelvis was performed using the standard protocol following bolus administration of intravenous contrast. Sagittal and coronal MPR images reconstructed from axial data set. CONTRAST:  140m ISOVUE-300 IOPAMIDOL (ISOVUE-300) INJECTION 61% IV. No dilute oral contrast. COMPARISON:  None FINDINGS: Lower chest: Lung bases clear Hepatobiliary: Gallbladder and liver  normal appearance Pancreas: Normal appearance Spleen: Normal appearance Adrenals/Urinary Tract: Adrenal glands, kidneys, ureters, and bladder normal appearance Stomach/Bowel: Stomach and bowel loops grossly unremarkable for exam lacking GI contrast. Appendix: Location: Posterior and slightly lateral to cecum Diameter: Proximally normal caliber, distally dilated up to 12 mm Appendicolith: Small appendicoliths up to 5 mm diameter  versus inspissated material Mucosal hyper-enhancement: Absent Extraluminal gas: None Periappendiceal collection: Periappendiceal edema/infiltration without discrete abscess collection Vascular/Lymphatic: Unremarkable Reproductive: Prostate gland and seminal vesicles unremarkable Other: Small amount of nonspecific free pelvic fluid.  No free air. Musculoskeletal: Bone islands at LEFT femoral neck and intertrochanteric region IMPRESSION: Acute appendicitis. Electronically Signed   By: Lavonia Dana M.D.   On: 12/02/2016 12:52     Assessment/Plan  This patient with a history and physical findings of acute appendicitis which is confirmed both with a white blood cell count elevation as well as CT findings.  I recommended laparoscopic appendectomy.  I discussed with he and his family the options of IV antibiotic therapy and they have chosen surgery at this point we discussed the risk of bleeding infection recurrence abscess formation and conversion to an open procedure they understood and agreed to proceed.  Depending on the timing of this operation it may be performed by me or by Dr. Rosana Hoes later tonight  Florene Glen, MD, FACS

## 2016-12-03 ENCOUNTER — Encounter: Payer: Self-pay | Admitting: Surgery

## 2016-12-03 DIAGNOSIS — K353 Acute appendicitis with localized peritonitis, without perforation or gangrene: Secondary | ICD-10-CM | POA: Diagnosis not present

## 2016-12-03 MED ORDER — PIPERACILLIN-TAZOBACTAM 3.375 G IVPB
3.3750 g | Freq: Three times a day (TID) | INTRAVENOUS | Status: DC
  Administered 2016-12-03: 3.375 g via INTRAVENOUS
  Filled 2016-12-03: qty 50

## 2016-12-03 MED ORDER — ONDANSETRON 4 MG PO TBDP: 4.0000 mg | ORAL_TABLET | Freq: Four times a day (QID) | ORAL | Status: DC | PRN

## 2016-12-03 MED ORDER — ACETAMINOPHEN 325 MG PO TABS: 650.0000 mg | ORAL_TABLET | Freq: Four times a day (QID) | ORAL | Status: DC | PRN

## 2016-12-03 MED ORDER — OXYCODONE-ACETAMINOPHEN 5-325 MG PO TABS
1.0000 | ORAL_TABLET | ORAL | Status: DC | PRN
  Administered 2016-12-03: 2 via ORAL
  Filled 2016-12-03: qty 2

## 2016-12-03 MED ORDER — ACETAMINOPHEN 650 MG RE SUPP
650.0000 mg | Freq: Four times a day (QID) | RECTAL | Status: DC | PRN
  Filled 2016-12-03: qty 1

## 2016-12-03 MED ORDER — DEXTROSE IN LACTATED RINGERS 5 % IV SOLN
INTRAVENOUS | Status: DC
  Administered 2016-12-03: 01:00:00 via INTRAVENOUS

## 2016-12-03 MED ORDER — OXYCODONE-ACETAMINOPHEN 5-325 MG PO TABS: 1.0000 | ORAL_TABLET | ORAL | 0 refills | Status: DC | PRN

## 2016-12-03 MED ORDER — ONDANSETRON HCL 4 MG/2ML IJ SOLN: 4.0000 mg | Freq: Four times a day (QID) | INTRAMUSCULAR | Status: DC | PRN

## 2016-12-03 MED ORDER — KETOROLAC TROMETHAMINE 30 MG/ML IJ SOLN
30.0000 mg | Freq: Four times a day (QID) | INTRAMUSCULAR | Status: DC
  Administered 2016-12-03: 30 mg via INTRAVENOUS
  Filled 2016-12-03: qty 1

## 2016-12-03 MED ORDER — MORPHINE SULFATE (PF) 2 MG/ML IV SOLN
2.0000 mg | INTRAVENOUS | Status: DC | PRN
  Administered 2016-12-03: 2 mg via INTRAVENOUS
  Filled 2016-12-03: qty 1

## 2016-12-03 MED ORDER — PIPERACILLIN-TAZOBACTAM 3.375 G IVPB: 3.3750 g | Freq: Three times a day (TID) | INTRAVENOUS | Status: DC

## 2016-12-03 NOTE — Op Note (Signed)
SURGICAL OPERATIVE REPORT  DATE OF PROCEDURE: 12/03/2016  ATTENDING Surgeon(s): Ancil Linseyavis, Jason Evan, MD  ANESTHESIA: GETA (General)  PRE-OPERATIVE DIAGNOSIS: Acute non-perforated appendicitis with localized peritonitis (K35.3)  POST-OPERATIVE DIAGNOSIS: Acute non-perforated appendicitis with localized peritonitis (K35.3)  PROCEDURE(S):  1.) Laparoscopic appendectomy (cpt: 44970)  INTRAOPERATIVE FINDINGS: Severely inflamed non-perforated appendix surrounded by periappendiceal inflammation without ascites  INTRAVENOUS FLUIDS: 600 mL crystalloid   ESTIMATED BLOOD LOSS: Minimal (<20 mL)  URINE OUTPUT: 100 mL   SPECIMENS: Appendix  IMPLANTS: None  DRAINS: None  COMPLICATIONS: None apparent  CONDITION AT END OF PROCEDURE: Hemodynamically stable and extubated  DISPOSITION OF PATIENT: PACU  INDICATIONS FOR PROCEDURE:  Patient is a 21 y.o. otherwise reportedly healthy male who presented with acute onset of epigastric abdominal pain that progressed to become greatest at the Right lower quadrant, associated with nausea and vomiting. Patient otherwise denied any change in bowel habits, fever/chills, CP, or SOB and reported the pain was somewhat controlled pre-operatively. All risks, benefits, and alternatives to appendectomy were discussed with the patient, all of patient's questions were answered to his expressed satisfaction, and informed consent was accordingly obtained and documented.  DETAILS OF PROCEDURE: Patient was brought to the operating suite and appropriately identified. General anesthesia was administered along with confirmation of appropriate pre-operative antibiotics, and endotracheal intubation was performed by anesthetist, along with NG/OG tube for gastric decompression. In supine position, operative site was prepped and draped in usual sterile fashion, and following a brief time out, initial 5 mm incision was made in a natural skin crease just below the umbilicus. Fascia  was then elevated, and a Verress needle was inserted and its proper position confirmed using saline meniscus test prior to abdominal insufflation.  Upon insufflation of the abdominal cavity with carbon dioxide to a well-tolerated pressure of 12-15 mmHg, a 5 mm peri-umbilical port followed by laparoscope were inserted and used to inspect the abdominal cavity and its contents with no injuries from insertion of the first trochar noted. Two additional trocars were inserted, a 12 mm port at the Left lower quadrant position and another 5 mm port at the suprapubic position. The table was then placed in Trendelenburg position with the Right side up, and blunt graspers were gently used to retract the bowel overlying a clearly inflamed appendix surrounded by moderate peri-appendiceal inflammation and no ascites. The appendix was gently retracted by near its tip, and the base of the appendix and mesoappendix were identified in relation to the cecum. The mesoappendix was dissected from the visceral appendix and hemostasis achieved using a harmonic scalpel. Upon freeing the visceral appendix from the mesoappendix, an endostapler loaded with a standard blue tissue load was advanced across the base of the visceral appendix, which was compressed for several seconds, and the stapler was deployed and removed from the abdominal cavity. Hemostasis was confirmed, and the specimen was extracted from the abdominal cavity in a laparoscopic specimen bag.  The intraperitoneal cavity was inspected with no additional findings. PMI laparoscopic fascial closure device was then used to re-approximate fascia at the 12 mm Left lower quadrant port site. All ports were then removed under direct visualization, and the abdominal cavity was desuflated. All port sites were irrigated/cleaned, additional local anesthetic was injected at each incision, 3-0 Vicryl was used to re-approximate dermis at 10/12 mm port site(s), and subcuticular 4-0 Monocryl  suture was used to re-approximate skin. Skin was then cleaned, dried, and sterile skin glue was applied. Patient was then safely able to be awakened, extubated, and  transferred to PACU for post-operative monitoring and care.   I was present for all aspects of procedure, and there were no intra-operative complications apparent.

## 2016-12-03 NOTE — Anesthesia Postprocedure Evaluation (Signed)
Anesthesia Post Note  Patient: Andre Howard  Procedure(s) Performed: APPENDECTOMY LAPAROSCOPIC (N/A Abdomen)  Patient location during evaluation: PACU Anesthesia Type: General Level of consciousness: awake and alert and oriented Pain management: pain level controlled Vital Signs Assessment: post-procedure vital signs reviewed and stable Respiratory status: spontaneous breathing Cardiovascular status: blood pressure returned to baseline Anesthetic complications: no     Last Vitals:  Vitals:   12/02/16 2113 12/02/16 2352  BP: 119/65 (!) 115/45  Pulse: 77 96  Resp: 18 18  Temp: 37.2 C 37.1 C  SpO2: 100% 95%    Last Pain:  Vitals:   12/02/16 2352  TempSrc:   PainSc: Asleep                 Maddax Palinkas

## 2016-12-03 NOTE — Discharge Summary (Signed)
Physician Discharge Summary  Patient ID: Andre Howard MRN: [id number] DOB/AGE: 06/04/1995 21 y.o.  Admit date: 12/02/2016 Discharge date: 12/03/2016   Discharge Diagnoses:  Active Problems:   Acute appendicitis   Acute appendicitis with localized peritonitis   Procedures: Laparoscopic appendectomy  Hospital Course: This patient admitted to the hospital with a diagnosis of acute appendicitis.  Dr. Earlene Plateravis took him to the operating room where acute appendicitis was confirmed there was no sign of rupture.  Patient is tolerating a clear liquid diet and being advanced to regular this morning he will be discharged in stable condition to follow-up in our office in 10 days with wound instructions.  He will be given oral analgesics for pain.  Consults: None  Disposition: 01-Home or Self Care   Allergies as of 12/03/2016   No Known Allergies     Medication List    TAKE these medications   cetirizine 10 MG tablet Commonly known as:  ZYRTEC Take 1 tablet daily by mouth.   montelukast 10 MG tablet Commonly known as:  SINGULAIR Take 1 tablet daily by mouth.   oxyCODONE-acetaminophen 5-325 MG tablet Commonly known as:  PERCOCET/ROXICET Take 1-2 tablets every 4 (four) hours as needed by mouth for moderate pain.   PROAIR HFA 108 (90 Base) MCG/ACT inhaler Generic drug:  albuterol Inhale 2 Inhalers as needed into the lungs.   SYMBICORT 160-4.5 MCG/ACT inhaler Generic drug:  budesonide-formoterol Inhale 2 puffs daily into the lungs.      Follow-up Information    [facility] [place]. Schedule an appointment as soon as possible for a visit in 2 week(s).   Specialty:  General Surgery Contact information: [address] [place] [id number] [phone]          Lattie Hawichard E Tallia Moehring, MD, FACS

## 2016-12-03 NOTE — Discharge Instructions (Signed)
In addition to included general post-operative instructions for Laparoscopic Appendectomy,  Diet: Resume home heart healthy diet.   Activity: No heavy lifting >15 - 20 pounds (children, pets, laundry, garbage) or strenuous activity until follow-up, but light activity and walking are encouraged. Do not drive or drink alcohol if taking narcotic pain medications.  Wound care: 2 days after surgery (Saturday, 11/10), may shower/get incision wet with soapy water and pat dry (do not rub incisions), but no baths or submerging incision underwater until follow-up.   Medications: Resume all home medications. For mild to moderate pain: acetaminophen (Tylenol) or ibuprofen (if no kidney disease). Combining Tylenol with alcohol can substantially increase your risk of causing liver disease. Narcotic pain medications, if prescribed, can be used for severe pain, though may cause nausea, constipation, and drowsiness. Do not combine Tylenol and Percocet (or similar) within a 6 hour period as Percocet (and similar) contain(s) Tylenol. If you do not need the narcotic pain medication, you do not need to fill the prescription.  Call office [phone]) at any time if any questions, worsening pain, fevers/chills, bleeding, drainage from incision site, or other concerns.

## 2016-12-03 NOTE — Progress Notes (Signed)
1 Day Post-Op  Subjective: Status post laparoscopic appendectomy.  Patient has no complaints today minimal pain.  Tolerating clear liquids.    Objective: Vital signs in last 24 hours: Temp:  [97.5 F (36.4 C)-99 F (37.2 C)] 98.3 F (36.8 C) (11/09 0625) Pulse Rate:  [55-104] 86 (11/09 0625) Resp:  [14-22] 16 (11/09 0625) BP: (103-133)/(45-81) 105/50 (11/09 0625) SpO2:  [85 %-100 %] 98 % (11/09 0625) Weight:  [218 lb (98.9 kg)-255 lb (115.7 kg)] 218 lb (98.9 kg) (11/08 1636) Last BM Date: 12/02/16  Intake/Output from previous day: 11/08 0701 - 11/09 0700 In: 3059.4 [P.O.:360; I.V.:1599.4; IV Piggyback:1100] Out: 305 [Urine:300; Blood:5] Intake/Output this shift: No intake/output data recorded.  Physical exam:  Wounds clean no erythema no drainage soft nontender abdomen.  Lab Results: CBC  Recent Labs    12/02/16 1051  WBC 12.7*  HGB 15.1  HCT 45.5  PLT 160   BMET Recent Labs    12/02/16 1051  NA 136  K 3.5  CL 105  CO2 20*  GLUCOSE 126*  BUN 13  CREATININE 0.97  CALCIUM 10.0   PT/INR No results for input(s): LABPROT, INR in the last 72 hours. ABG No results for input(s): PHART, HCO3 in the last 72 hours.  Invalid input(s): PCO2, PO2  Studies/Results: Ct Abdomen Pelvis W Contrast  Result Date: 12/02/2016 CLINICAL DATA:  RIGHT lower abdominal pain with nausea and vomiting EXAM: CT ABDOMEN AND PELVIS WITH CONTRAST TECHNIQUE: Multidetector CT imaging of the abdomen and pelvis was performed using the standard protocol following bolus administration of intravenous contrast. Sagittal and coronal MPR images reconstructed from axial data set. CONTRAST:  100mL ISOVUE-300 IOPAMIDOL (ISOVUE-300) INJECTION 61% IV. No dilute oral contrast. COMPARISON:  None FINDINGS: Lower chest: Lung bases clear Hepatobiliary: Gallbladder and liver normal appearance Pancreas: Normal appearance Spleen: Normal appearance Adrenals/Urinary Tract: Adrenal glands, kidneys, ureters, and  bladder normal appearance Stomach/Bowel: Stomach and bowel loops grossly unremarkable for exam lacking GI contrast. Appendix: Location: Posterior and slightly lateral to cecum Diameter: Proximally normal caliber, distally dilated up to 12 mm Appendicolith: Small appendicoliths up to 5 mm diameter versus inspissated material Mucosal hyper-enhancement: Absent Extraluminal gas: None Periappendiceal collection: Periappendiceal edema/infiltration without discrete abscess collection Vascular/Lymphatic: Unremarkable Reproductive: Prostate gland and seminal vesicles unremarkable Other: Small amount of nonspecific free pelvic fluid.  No free air. Musculoskeletal: Bone islands at LEFT femoral neck and intertrochanteric region IMPRESSION: Acute appendicitis. Electronically Signed   By: Ulyses SouthwardMark  Boles M.D.   On: 12/02/2016 12:52    Anti-infectives: Anti-infectives (From admission, onward)   Start     Dose/Rate Route Frequency Ordered Stop   12/03/16 0700  piperacillin-tazobactam (ZOSYN) IVPB 3.375 g     3.375 g 12.5 mL/hr over 240 Minutes Intravenous Every 8 hours 12/03/16 0153 12/03/16 2259   12/03/16 0115  piperacillin-tazobactam (ZOSYN) IVPB 3.375 g  Status:  Discontinued     3.375 g 12.5 mL/hr over 240 Minutes Intravenous Every 8 hours 12/03/16 0056 12/03/16 0153   12/02/16 2200  piperacillin-tazobactam (ZOSYN) IVPB 3.375 g  Status:  Discontinued     3.375 g 12.5 mL/hr over 240 Minutes Intravenous Every 8 hours 12/02/16 2138 12/03/16 0029   12/02/16 1430  piperacillin-tazobactam (ZOSYN) IVPB 3.375 g     3.375 g 100 mL/hr over 30 Minutes Intravenous  Once 12/02/16 1419 12/02/16 1520      Assessment/Plan: s/p Procedure(s): APPENDECTOMY LAPAROSCOPIC   Advance diet home later today patient doing very well follow-up next week.  Lattie Hawichard E Dezyrae Kensinger,  MD, FACS  12/03/2016

## 2016-12-03 NOTE — Progress Notes (Signed)
Discharge instructions explained to pt and pts family/ verbalized an understanding/ iv removed/ will transport off unit via wheelchair.

## 2016-12-04 LAB — HIV ANTIBODY (ROUTINE TESTING W REFLEX): HIV SCREEN 4TH GENERATION: NONREACTIVE

## 2016-12-06 LAB — SURGICAL PATHOLOGY

## 2016-12-13 ENCOUNTER — Other Ambulatory Visit: Payer: Self-pay

## 2016-12-15 ENCOUNTER — Ambulatory Visit (INDEPENDENT_AMBULATORY_CARE_PROVIDER_SITE_OTHER): Payer: Medicaid Other | Admitting: Surgery

## 2016-12-15 ENCOUNTER — Encounter: Payer: Self-pay | Admitting: Surgery

## 2016-12-15 VITALS — BP 130/79 | HR 88 | Temp 97.9°F | Ht 71.0 in | Wt 215.0 lb

## 2016-12-15 DIAGNOSIS — Z09 Encounter for follow-up examination after completed treatment for conditions other than malignant neoplasm: Secondary | ICD-10-CM

## 2016-12-15 NOTE — Patient Instructions (Addendum)

## 2016-12-15 NOTE — Progress Notes (Signed)
12/15/2016  HPI: Patient is s/p laparoscopic appendectomy with Dr. Earlene Plateravis on 11/8.  Presents today for follow up.  Has been eating well with good appetite and bowel function.  He does have intermittent constipation but otherwise no abdominal pain, nausea, or vomiting.  Vital signs: BP 130/79   Pulse 88   Temp 97.9 F (36.6 C) (Oral)   Ht 5\' 11"  (1.803 m)   Wt 97.5 kg (215 lb)   BMI 29.99 kg/m    Physical Exam: Constitutional:  No acute distress Abdomen: soft, nondistended, nontender to palpation.  Incisions are clean, dry, and intact.  Assessment/Plan: 21 yo male s/p laparoscopic appendectomy.  --pathology reviewed with patient --discussed with patient that he still has a no heavy lifting or pushing restriction until 12/6.  He will get a work note so he can go back to work with full duties on 12/10.   --Patient may follow up with us on an as needed basis.   Andre IllJose Luis Bradely Rudin, MD [facility]

## 2017-01-13 ENCOUNTER — Encounter: Payer: Self-pay | Admitting: Surgery

## 2017-10-26 ENCOUNTER — Other Ambulatory Visit: Payer: Self-pay

## 2017-10-26 ENCOUNTER — Emergency Department: Payer: Self-pay

## 2017-10-26 ENCOUNTER — Emergency Department
Admission: EM | Admit: 2017-10-26 | Discharge: 2017-10-26 | Disposition: A | Discharge status: Home / Self Care | Payer: Self-pay | Attending: Emergency Medicine | Admitting: Emergency Medicine

## 2017-10-26 DIAGNOSIS — R1011 Right upper quadrant pain: Secondary | ICD-10-CM | POA: Insufficient documentation

## 2017-10-26 DIAGNOSIS — Z79899 Other long term (current) drug therapy: Secondary | ICD-10-CM | POA: Insufficient documentation

## 2017-10-26 DIAGNOSIS — R112 Nausea with vomiting, unspecified: Secondary | ICD-10-CM | POA: Insufficient documentation

## 2017-10-26 DIAGNOSIS — J45909 Unspecified asthma, uncomplicated: Secondary | ICD-10-CM | POA: Insufficient documentation

## 2017-10-26 DIAGNOSIS — R197 Diarrhea, unspecified: Secondary | ICD-10-CM | POA: Insufficient documentation

## 2017-10-26 LAB — CBC
HEMATOCRIT: 44.2 % (ref 40.0–52.0)
HEMOGLOBIN: 15.7 g/dL (ref 13.0–18.0)
MCH: 30.8 pg (ref 26.0–34.0)
MCHC: 35.5 g/dL (ref 32.0–36.0)
MCV: 86.7 fL (ref 80.0–100.0)
Platelets: 181 10*3/uL (ref 150–440)
RBC: 5.1 MIL/uL (ref 4.40–5.90)
RDW: 13.3 % (ref 11.5–14.5)
WBC: 5.1 10*3/uL (ref 3.8–10.6)

## 2017-10-26 LAB — LIPASE, BLOOD: LIPASE: 30 U/L (ref 11–51)

## 2017-10-26 LAB — COMPREHENSIVE METABOLIC PANEL
ALBUMIN: 5.1 g/dL — AB (ref 3.5–5.0)
ALT: 24 U/L (ref 0–44)
AST: 19 U/L (ref 15–41)
Alkaline Phosphatase: 84 U/L (ref 38–126)
Anion gap: 8 (ref 5–15)
BUN: 14 mg/dL (ref 6–20)
CHLORIDE: 106 mmol/L (ref 98–111)
CO2: 26 mmol/L (ref 22–32)
Calcium: 9.9 mg/dL (ref 8.9–10.3)
Creatinine, Ser: 1.06 mg/dL (ref 0.61–1.24)
GFR calc Af Amer: >60 mL/min (ref >60)
GLUCOSE: 98 mg/dL (ref 70–99)
POTASSIUM: 3.9 mmol/L (ref 3.5–5.1)
SODIUM: 140 mmol/L (ref 135–145)
TOTAL PROTEIN: 8.2 g/dL — AB (ref 6.5–8.1)
Total Bilirubin: 0.8 mg/dL (ref 0.3–1.2)

## 2017-10-26 MED ORDER — ONDANSETRON 4 MG PO TBDP: 4.0000 mg | ORAL_TABLET | Freq: Three times a day (TID) | ORAL | 0 refills | Status: DC | PRN

## 2017-10-26 MED ORDER — IOPAMIDOL (ISOVUE-300) INJECTION 61%
100.0000 mL | Freq: Once | INTRAVENOUS | Status: AC | PRN
  Administered 2017-10-26: 100 mL via INTRAVENOUS
  Filled 2017-10-26: qty 100

## 2017-10-26 MED ORDER — SODIUM CHLORIDE 0.9 % IV BOLUS
1000.0000 mL | Freq: Once | INTRAVENOUS | Status: AC
  Administered 2017-10-26: 1000 mL via INTRAVENOUS

## 2017-10-26 MED ORDER — ONDANSETRON HCL 4 MG/2ML IJ SOLN
4.0000 mg | Freq: Once | INTRAMUSCULAR | Status: AC
  Administered 2017-10-26: 4 mg via INTRAVENOUS
  Filled 2017-10-26: qty 2

## 2017-10-26 NOTE — ED Provider Notes (Signed)
[facility] Emergency Department Provider Note  ____________________________________________   First MD Initiated Contact with Patient 10/26/17 1315     (approximate)  I have reviewed the triage vital signs and the nursing notes.   HISTORY  Chief Complaint Abdominal Pain    HPI Andre Howard is a 22 y.o. male presents to the ER with his mother.   Pt c/o vomiting and diarrhea, sx for 3 days, no fever/chills, abdominal pain in ruq and right flank, states his stools are dark black, used pepto before noticed dark stools prior to using the pepto.  Denies dysuria.  No vomiting today, 2 episodes of diarrhea today.  His mother is concerned he might have gallbladder issues.  Hx of appendectomy.  denies cp/sob, denies camping, bad food, recent antibiotics, or exposure to bad water.  Noone else at home is sick   Past Medical History:  Diagnosis Date  . Asthma     Patient Active Problem List   Diagnosis Date Noted  . Acute appendicitis with localized peritonitis   . Acute appendicitis 12/02/2016    Past Surgical History:  Procedure Laterality Date  . LAPAROSCOPIC APPENDECTOMY N/A 12/02/2016   Procedure: APPENDECTOMY LAPAROSCOPIC;  Surgeon: Ancil Linsey, MD;  Location: [facility] ORS;  Service: General;  Laterality: N/A;    Prior to Admission medications   Medication Sig Start Date End Date Taking? Authorizing Provider  albuterol (PROAIR HFA) 108 (90 Base) MCG/ACT inhaler Inhale 2 Inhalers as needed into the lungs. 09/16/14   [provider]  budesonide-formoterol (SYMBICORT) 160-4.5 MCG/ACT inhaler Inhale 2 puffs daily into the lungs. 03/09/16   [provider]  cetirizine (ZYRTEC) 10 MG tablet Take 1 tablet daily by mouth. 09/08/16   [provider]  montelukast (SINGULAIR) 10 MG tablet Take 1 tablet daily by mouth. 03/09/16   [provider]  Olopatadine HCl (PATADAY) 0.2 % SOLN INSTILL ONE DROP INTO EACH EYE ONCE DAILY AS  NEEDED FOR EYE ALLERGY SYMPTOMS 09/08/16   [provider]  ondansetron (ZOFRAN-ODT) 4 MG disintegrating tablet Take 1 tablet (4 mg total) by mouth every 8 (eight) hours as needed for nausea or vomiting. 10/26/17   Faythe Ghee, PA-C    Allergies Patient has no known allergies.  No family history on file.  Social History Social History   Tobacco Use  . Smoking status: Never Smoker  . Smokeless tobacco: Never Used  Substance Use Topics  . Alcohol use: No  . Drug use: No    Review of Systems  Constitutional: No fever/chills Eyes: No visual changes. ENT: No sore throat. Respiratory: Denies cough Gastrointestinal: Positive for vomiting and diarrhea, and right-sided abdominal pain Genitourinary: Negative for dysuria. Musculoskeletal: Negative for back pain. Skin: Negative for rash.    ____________________________________________   PHYSICAL EXAM:  VITAL SIGNS: ED Triage Vitals  Enc Vitals Group     BP --      Pulse --      Resp --      Temp --      Temp src --      SpO2 --      Weight 10/26/17 1137 220 lb (99.8 kg)     Height 10/26/17 1137 6' (1.829 m)     Head Circumference --      Peak Flow --      Pain Score 10/26/17 1136 7     Pain Loc --      Pain Edu? --  Excl. in [place]? --     Constitutional: Alert and oriented. Well appearing and in no acute distress. Eyes: Conjunctivae are normal.  Head: Atraumatic. Nose: No congestion/rhinnorhea. Mouth/Throat: Mucous membranes are moist.   Neck:  supple no lymphadenopathy noted Cardiovascular: Normal rate, regular rhythm. Heart sounds are normal Respiratory: Normal respiratory effort.  No retractions, lungs c t a  Abd: soft nontender bs decreased in the upper quadrants, normal in the lower quadrants, tenderness in the right upper quadrant and right flank.  No rebound tenderness is noted.  Negative Murphy sign.   GU: deferred Musculoskeletal: FROM all extremities, warm and well perfused Neurologic:   Normal speech and language.  Skin:  Skin is warm, dry and intact. No rash noted. Psychiatric: Mood and affect are normal. Speech and behavior are normal.  ____________________________________________   LABS (all labs ordered are listed, but only abnormal results are displayed)  Labs Reviewed  COMPREHENSIVE METABOLIC PANEL - Abnormal; Notable for the following components:      Result Value   Total Protein 8.2 (*)    Albumin 5.1 (*)    All other components within normal limits  LIPASE, BLOOD  CBC  URINALYSIS, COMPLETE (UACMP) WITH MICROSCOPIC   ____________________________________________   ____________________________________________  RADIOLOGY  CT the abdomen and pelvis is negative  ____________________________________________   PROCEDURES  Procedure(s) performed: saline lock, zofran, ns 1 liter iv And Hemoccult is negative Procedures    ____________________________________________   INITIAL IMPRESSION / ASSESSMENT AND PLAN / ED COURSE  Pertinent labs & imaging results that were available during my care of the patient were reviewed by me and considered in my medical decision making (see chart for details).   Patient is 22 year old male presents emergency department complaining of abdominal pain with vomiting every morning.  He states he last vomited yesterday but has had loose stools today.  He noticed dark stools on Sunday and he took Pepto-Bismol yesterday.  He denies any fever or chills.  On physical exam patient appears well.  He is nontoxic.  The abdomen is soft but tender in the right lower and left lower quadrants.  Some tenderness noted in the right upper quadrant.  Bowel sounds are normal.  Differential diagnosis is acute cholecystitis, diverticulitis, acute viral gastroenteritis, colitis.  Labs for CBC is normal, comprehensive metabolic panel is normal, lipase is 30 and negative for pancreatitis  CT the abdomen pelvis is negative  Explained all the  test results to the family.  The patient is to stay on a clear liquid diet for 24 hours and then go to a bland diet.  He is to follow-up with a GI doctor if he notices dark stools in 1 week.  He is not to take anymore Pepto-Bismol as this will alter the color of stools.  He can take over-the-counter Imodium right ear if he has a lot of diarrhea.  He was given a prescription for Zofran.  He is to return to the emergency department if worsening.  All other family members state they understand and will comply.  They will bring him back if he is worsening.  He was discharged in stable condition in the care of his family.    As part of my medical decision making, I reviewed the following data within the electronic MEDICAL RECORD NUMBER History obtained from family, Nursing notes reviewed and incorporated, Labs reviewed CBC, lipase, and Mitzie are all negative, Old chart reviewed, Radiograph reviewed CT of the abdomen pelvis is negative, Notes from prior ED  visits and [place] Controlled Substance Database  ____________________________________________   FINAL CLINICAL IMPRESSION(S) / ED DIAGNOSES  Final diagnoses:  Right upper quadrant abdominal pain  Nausea vomiting and diarrhea      NEW MEDICATIONS STARTED DURING THIS VISIT:  Discharge Medication List as of 10/26/2017  3:04 PM    START taking these medications   Details  ondansetron (ZOFRAN-ODT) 4 MG disintegrating tablet Take 1 tablet (4 mg total) by mouth every 8 (eight) hours as needed for nausea or vomiting., Starting Wed 10/26/2017, Print         Note:  This document was prepared using Dragon voice recognition software and may include unintentional dictation errors.     Faythe Ghee, PA-C 10/26/17 1744    Emily Filbert, MD 10/27/17 [phone]

## 2017-10-26 NOTE — ED Notes (Signed)
See triage note  Presents with right sided abd pain states pain started on Sunday  States he has been vomiting every morning  Last time vomited was yesterday  But states he has had some loose stools  States he noticed some dark stools  States he only took 1 dose of Pepto Bismol and stools were dark prior to meds   No fever  abd soft but slightly tender on right side

## 2017-10-26 NOTE — ED Triage Notes (Signed)
Pt reports abdominal pain, N/V.  Started on Sunday.  Pain in RUQ, radiating down.  Tried Pepto Bismol and Tylenol at home with no relief.  Reports stool is black- last BM today.

## 2017-10-26 NOTE — Discharge Instructions (Addendum)
Follow-up with your regular doctor if not better in 3 to 5 days.  Return emergency department if worsening.  Take the Zofran as needed for nausea and vomiting.  If you have more diarrhea use Imodium AD as this will not darken your stools.  If you continue to have dark stools in 1 week please make an appointment with a GI doctor.  Dr. Norma Fredrickson is on-call for gastroenterology.

## 2018-10-20 ENCOUNTER — Other Ambulatory Visit: Payer: Self-pay

## 2018-10-20 ENCOUNTER — Emergency Department: Payer: Self-pay

## 2018-10-20 ENCOUNTER — Emergency Department
Admission: EM | Admit: 2018-10-20 | Discharge: 2018-10-20 | Disposition: A | Discharge status: Home / Self Care | Payer: Self-pay | Attending: Emergency Medicine | Admitting: Emergency Medicine

## 2018-10-20 DIAGNOSIS — M545 Low back pain, unspecified: Secondary | ICD-10-CM

## 2018-10-20 DIAGNOSIS — Z79899 Other long term (current) drug therapy: Secondary | ICD-10-CM | POA: Insufficient documentation

## 2018-10-20 DIAGNOSIS — J45909 Unspecified asthma, uncomplicated: Secondary | ICD-10-CM | POA: Insufficient documentation

## 2018-10-20 MED ORDER — CYCLOBENZAPRINE HCL 5 MG PO TABS: ORAL_TABLET | ORAL | 0 refills | Status: DC

## 2018-10-20 MED ORDER — KETOROLAC TROMETHAMINE 10 MG PO TABS: 10.0000 mg | ORAL_TABLET | Freq: Four times a day (QID) | ORAL | 0 refills | Status: DC | PRN

## 2018-10-20 MED ORDER — KETOROLAC TROMETHAMINE 30 MG/ML IJ SOLN
30.0000 mg | Freq: Once | INTRAMUSCULAR | Status: AC
  Administered 2018-10-20: 30 mg via INTRAMUSCULAR
  Filled 2018-10-20: qty 1

## 2018-10-20 MED ORDER — LIDOCAINE 5 % EX PTCH: 1.0000 | MEDICATED_PATCH | CUTANEOUS | 0 refills | Status: DC

## 2018-10-20 NOTE — Discharge Instructions (Signed)
You can take Toradol for pain and inflammation.  You take Flexeril to help relax your muscles.  Do not drive or work while taking the Flexeril.  Do not take any additional ibuprofen.  Please follow-up with Ortho.

## 2018-10-20 NOTE — ED Triage Notes (Signed)
Pt reports a couple weeks ago he was moving a refrigerator at work and hurt his lower back, pt states that it is across his lower back and denies any radiation into his legs. Denies wanting to pursue worker's comp

## 2018-10-20 NOTE — ED Provider Notes (Signed)
[facility] Emergency Department Provider Note  ____________________________________________  Time seen: Approximately 12:24 PM  I have reviewed the triage vital signs and the nursing notes.   HISTORY  Chief Complaint Back Pain    HPI Andre Howard is a 23 y.o. male male that presents to the emergency department for evaluation of back pain for 2 weeks after moving furniture at work.  Patient states that he believes he injured his back while he was moving a refrigerator.  He did not feel any specific pop or incident, just notes that it was very heavy.  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  Back became increasingly painful.  Pain does not radiate.  Pain is worse in the morning.  He has been taking ibuprofen, with some relief.  No bowel or bladder dysfunction or saddle anesthesias.  No numbness, tingling.   Past Medical History:  Diagnosis Date  . Asthma     Patient Active Problem List   Diagnosis Date Noted  . Acute appendicitis with localized peritonitis   . Acute appendicitis 12/02/2016    Past Surgical History:  Procedure Laterality Date  . LAPAROSCOPIC APPENDECTOMY N/A 12/02/2016   Procedure: APPENDECTOMY LAPAROSCOPIC;  Surgeon: Ancil Linsey, MD;  Location: [facility] ORS;  Service: General;  Laterality: N/A;    Prior to Admission medications   Medication Sig Start Date End Date Taking? Authorizing Provider  albuterol (PROAIR HFA) 108 (90 Base) MCG/ACT inhaler Inhale 2 Inhalers as needed into the lungs. 09/16/14   [provider]  budesonide-formoterol (SYMBICORT) 160-4.5 MCG/ACT inhaler Inhale 2 puffs daily into the lungs. 03/09/16   [provider]  cetirizine (ZYRTEC) 10 MG tablet Take 1 tablet daily by mouth. 09/08/16   [provider]  cyclobenzaprine (FLEXERIL) 5 MG tablet Take 1-2 tablets 3 times daily as needed 10/20/18   Enid Derry, PA-C   ketorolac (TORADOL) 10 MG tablet Take 1 tablet (10 mg total) by mouth every 6 (six) hours as needed. 10/20/18   Enid Derry, PA-C  lidocaine (LIDODERM) 5 % Place 1 patch onto the skin daily. Remove & Discard patch within 12 hours or as directed by MD 10/20/18   Enid Derry, PA-C  montelukast (SINGULAIR) 10 MG tablet Take 1 tablet daily by mouth. 03/09/16   [provider]  Olopatadine HCl (PATADAY) 0.2 % SOLN INSTILL ONE DROP INTO EACH EYE ONCE DAILY AS NEEDED FOR EYE ALLERGY SYMPTOMS 09/08/16   [provider]    Allergies Patient has no known allergies.  No family history on file.  Social History Social History   Tobacco Use  . Smoking status: Never Smoker  . Smokeless tobacco: Never Used  Substance Use Topics  . Alcohol use: No  . Drug use: No     Review of Systems  Constitutional: No fever/chills Respiratory: No SOB. Gastrointestinal: No abdominal pain.  No nausea, no vomiting.  Musculoskeletal: Positive for back pain. Skin: Negative for rash, abrasions, lacerations, ecchymosis. Neurological: Negative for headaches, numbness or tingling   ____________________________________________   PHYSICAL EXAM:  VITAL SIGNS: ED Triage Vitals  Enc Vitals Group     BP 10/20/18 1147 136/73     Pulse Rate 10/20/18 1147 76     Resp 10/20/18 1147 16     Temp 10/20/18 1147 97.8 F (36.6 C)     Temp Source 10/20/18 1147 Oral     SpO2 10/20/18 1147 97 %  Weight 10/20/18 1148 216 lb (98 kg)     Height 10/20/18 1148 6' (1.829 m)     Head Circumference --      Peak Flow --      Pain Score 10/20/18 1148 6     Pain Loc --      Pain Edu? --      Excl. in [place]? --      Constitutional: Alert and oriented. Well appearing and in no acute distress. Eyes: Conjunctivae are normal. PERRL. EOMI. Head: Atraumatic. ENT:      Ears:      Nose: No congestion/rhinnorhea.      Mouth/Throat: Mucous membranes are moist.  Neck: No stridor.  Cardiovascular: Normal rate,  regular rhythm.  Good peripheral circulation. Respiratory: Normal respiratory effort without tachypnea or retractions. Lungs CTAB. Good air entry to the bases with no decreased or absent breath sounds. Gastrointestinal: Bowel sounds 4 quadrants. Soft and nontender to palpation. No guarding or rigidity. No palpable masses. No distention. Musculoskeletal: Full range of motion to all extremities. No gross deformities appreciated.  Tenderness to palpation to lumbar spine and lumbar paraspinal muscles.  Strength equal in lower extremities bilaterally.  Normal gait. Neurologic:  Normal speech and language. No gross focal neurologic deficits are appreciated.  Skin:  Skin is warm, dry and intact. No rash noted. Psychiatric: Mood and affect are normal. Speech and behavior are normal. Patient exhibits appropriate insight and judgement.   ____________________________________________   LABS (all labs ordered are listed, but only abnormal results are displayed)  Labs Reviewed - No data to display ____________________________________________  EKG   ____________________________________________  RADIOLOGY Robinette Haines, personally viewed and evaluated these images (plain radiographs) as part of my medical decision making, as well as reviewing the written report by the radiologist.  Dg Lumbar Spine 2-3 Views  Result Date: 10/20/2018 CLINICAL DATA:  Pain while lifting heavy object EXAM: LUMBAR SPINE - 2-3 VIEW COMPARISON:  None. FINDINGS: Frontal, lateral, and spot lumbosacral lateral images were obtained. There are 6 non-rib-bearing lumbar type vertebral bodies. There is no fracture or spondylolisthesis. There is moderate disc space narrowing at L5-6 at L6-S1. Other disc spaces appear unremarkable. No erosive change. IMPRESSION: Space narrowing at L5-6 and L6-S1. Note 6 non-rib-bearing lumbar type vertebral bodies. No fracture or spondylolisthesis. Electronically Signed   By: Lowella Grip III M.D.    On: 10/20/2018 12:59    ____________________________________________    PROCEDURES  Procedure(s) performed:    Procedures    Medications  ketorolac (TORADOL) 30 MG/ML injection 30 mg (30 mg Intramuscular Given 10/20/18 1252)     ____________________________________________   INITIAL IMPRESSION / ASSESSMENT AND PLAN / ED COURSE  Pertinent labs & imaging results that were available during my care of the patient were reviewed by me and considered in my medical decision making (see chart for details).  Review of the [place] CSRS was performed in accordance of the [place] prior to dispensing any controlled drugs.   Patient presented to the emergency department for evaluation of low back pain after moving heavy furniture 2 weeks ago.  Vital signs and exam are reassuring.  Lumbar x-ray negative for acute bony abnormalities.  Patient will be discharged home with prescriptions for toradol, flexeril, lidoderm. Patient is to follow up with PCP or ortho as directed. Patient is given ED precautions to return to the ED for any worsening or new symptoms.   Andre Howard was evaluated in Emergency Department on 10/20/2018 for the symptoms described  in the history of present illness. He was evaluated in the context of the global COVID-19 pandemic, which necessitated consideration that the patient might be at risk for infection with the SARS-CoV-2 virus that causes COVID-19. Institutional protocols and algorithms that pertain to the evaluation of patients at risk for COVID-19 are in a state of rapid change based on information released by regulatory bodies including the CDC and federal and state organizations. These policies and algorithms were followed during the patient's care in the ED.  ____________________________________________  FINAL CLINICAL IMPRESSION(S) / ED DIAGNOSES  Final diagnoses:  Acute bilateral low back pain without sciatica      NEW MEDICATIONS STARTED DURING THIS  VISIT:  ED Discharge Orders         Ordered    ketorolac (TORADOL) 10 MG tablet  Every 6 hours PRN     10/20/18 1312    cyclobenzaprine (FLEXERIL) 5 MG tablet     10/20/18 1312    lidocaine (LIDODERM) 5 %  Every 24 hours     10/20/18 1312              This chart was dictated using voice recognition software/Dragon. Despite best efforts to proofread, errors can occur which can change the meaning. Any change was purely unintentional.    Enid DerryWagner, Egan Berkheimer, PA-C 10/20/18 1447    Emily FilbertWilliams, Jonathan E, MD 10/20/18 1504

## 2018-10-20 NOTE — ED Notes (Addendum)
See triage note  Presents with lower back pain    States pain started after moving a refrigerator   States this developed about 2 weeks ago  Ambulates well to treatment room  States he has been using IBU for pain

## 2019-03-08 IMAGING — CT CT ABD-PELV W/ CM
2 of 4 series · 16 of 46 positions shown, 18 images · IV contrast (APPLIED)
Comparison: CT abdomen pelvis of 12/02/2016

CLINICAL DATA: Diffuse abdominal pain, nausea and vomiting for
several days, particularly in the right upper quadrant, black stool
last night.

EXAM:
CT ABDOMEN AND PELVIS WITH CONTRAST
TECHNIQUE: Multidetector CT imaging of the abdomen and pelvis was performed
using the standard protocol following bolus administration of
intravenous contrast.
CONTRAST:  100mL UUDU78-BKK IOPAMIDOL (UUDU78-BKK) INJECTION 61%

[Series 2: routine abd/pel with · axial · 0.80mm/px · z∈[-479,-44]mm · 13 of 95 slices shown, 15 images]
[im 4/95  soft-tissue]
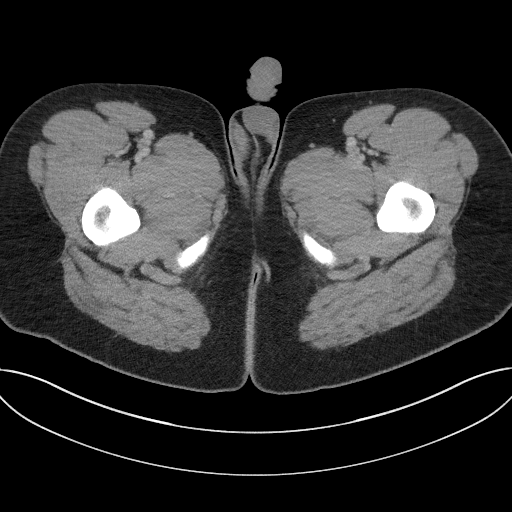
[im 4/95  bone]
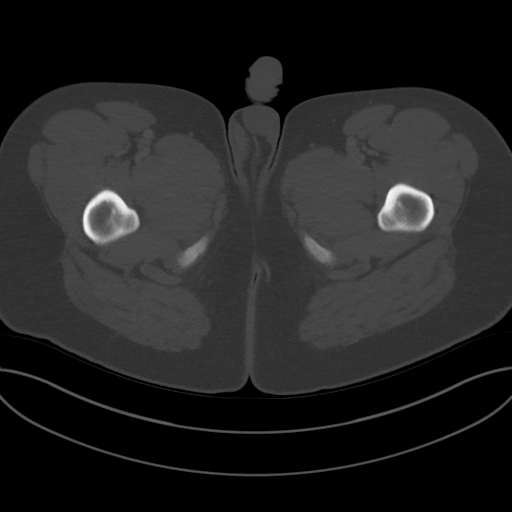
[im 12/95  soft-tissue]
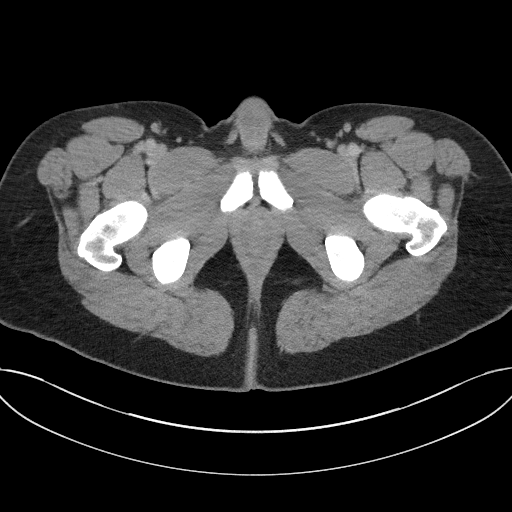
[im 20/95  soft-tissue]
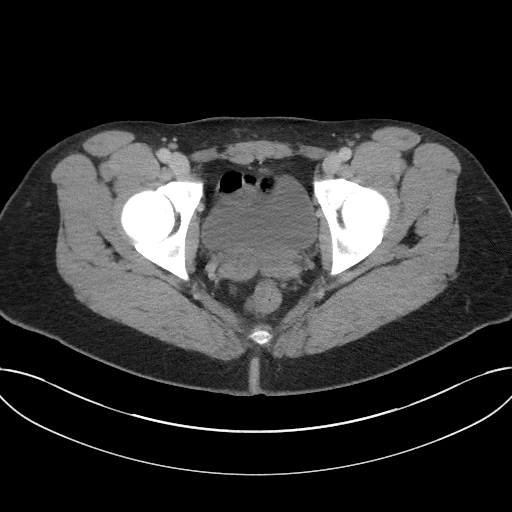
[im 28/95  soft-tissue]
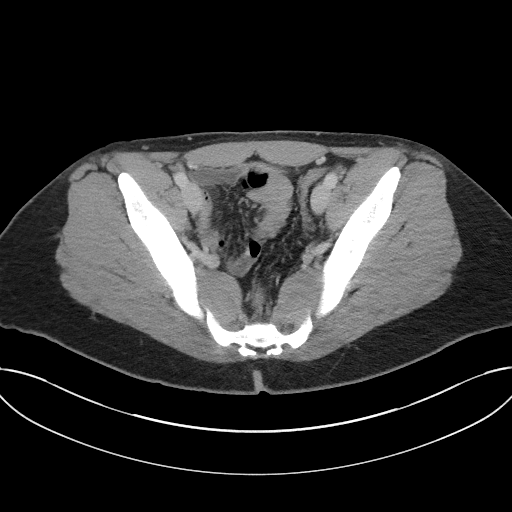
[im 32/95  soft-tissue]
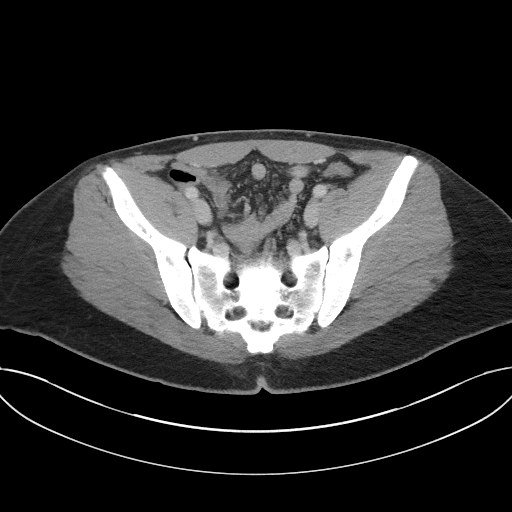
[im 40/95  soft-tissue]
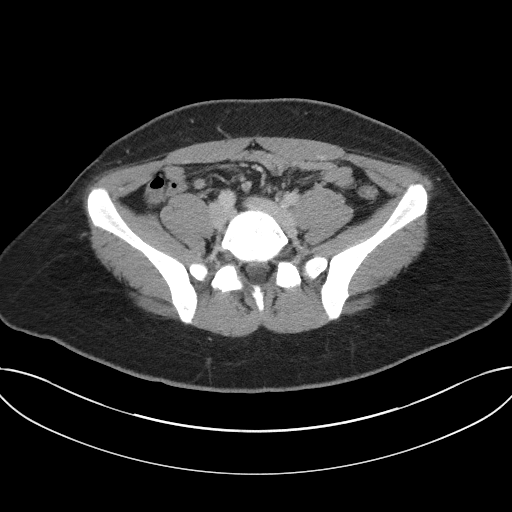
[im 48/95  soft-tissue]
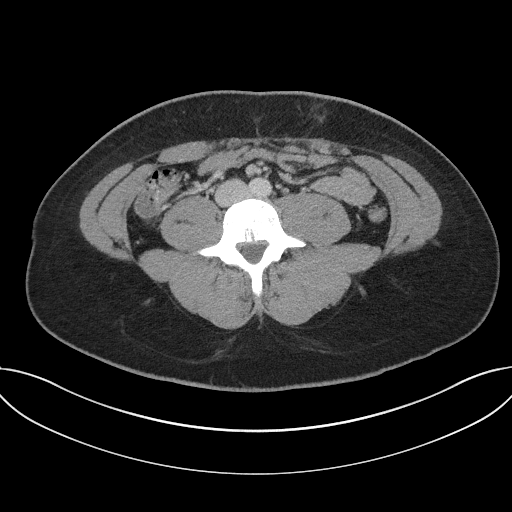
[im 55/95  soft-tissue]
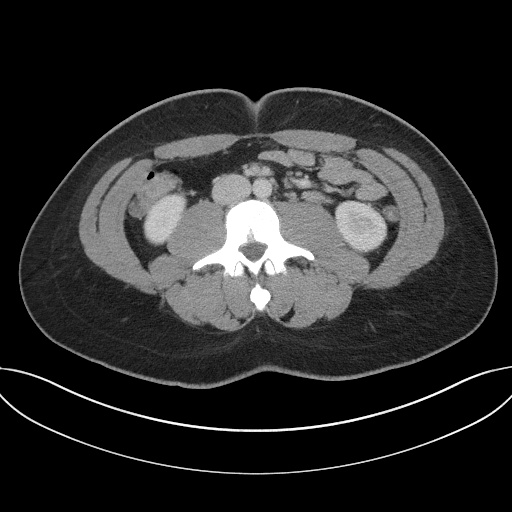
[im 63/95  soft-tissue]
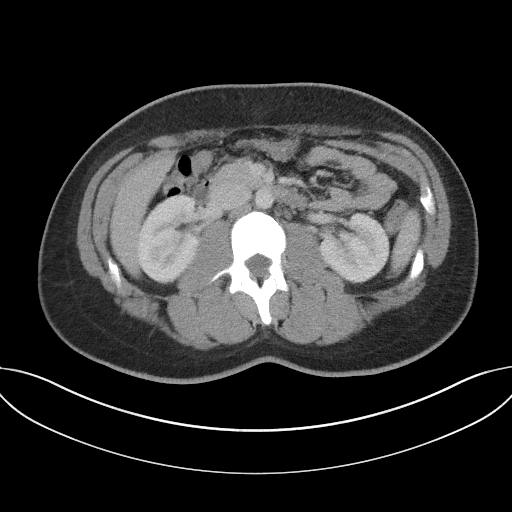
[im 63/95  bone]
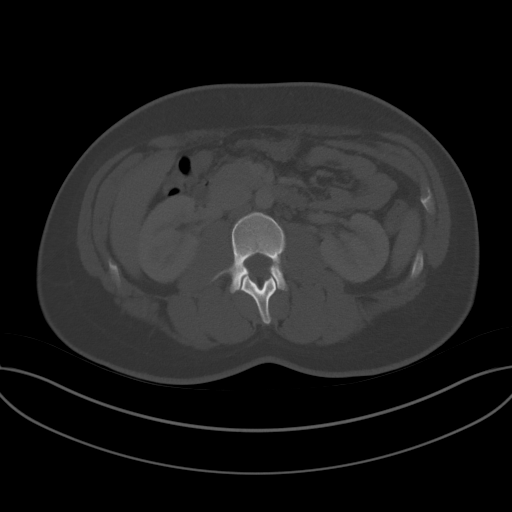
[im 67/95  soft-tissue]
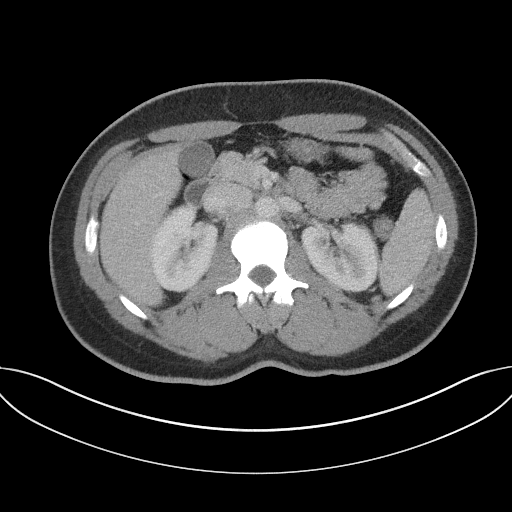
[im 75/95  soft-tissue]
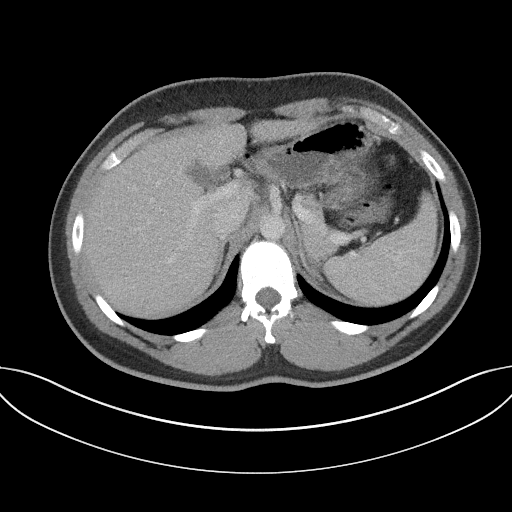
[im 83/95  soft-tissue]
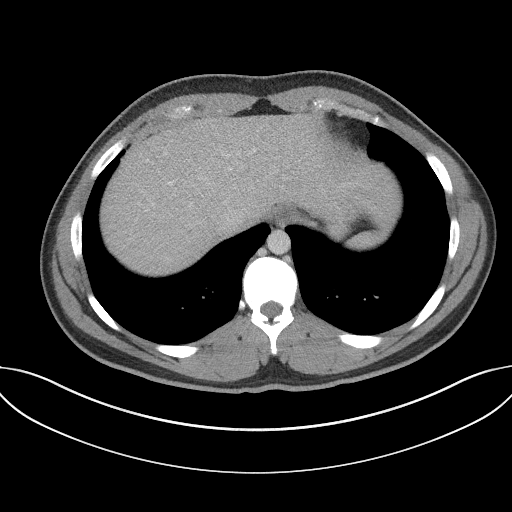
[im 91/95  soft-tissue]
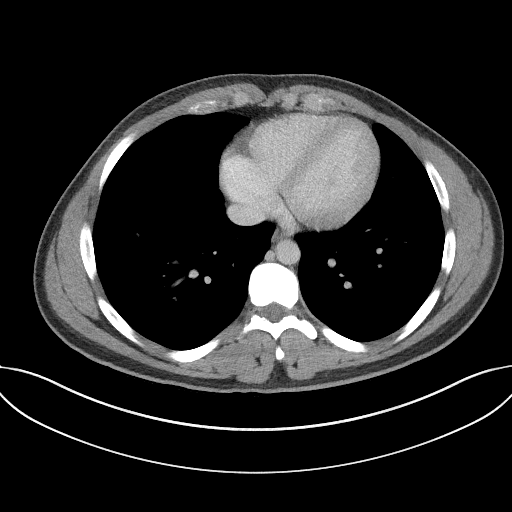

[Series 5: coronal st · coronal · 0.89mm/px · 3 of 87 slices shown]
[im 29/87  soft-tissue]
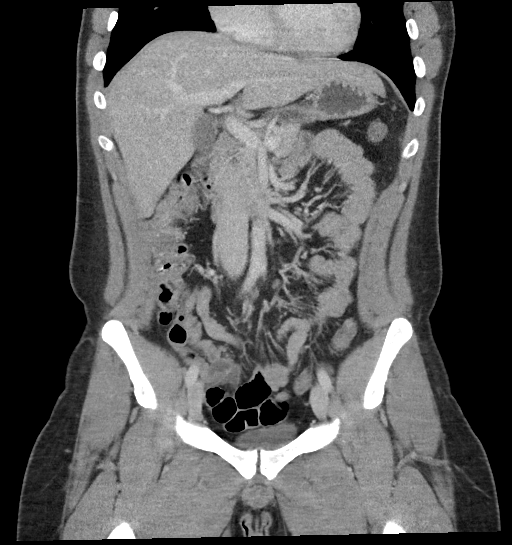
[im 39/87  soft-tissue]
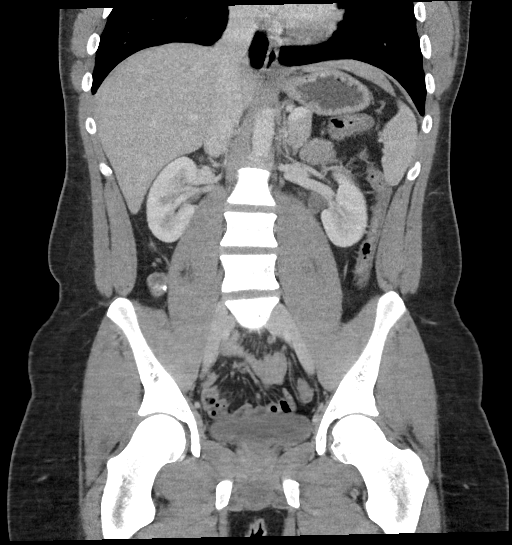
[im 48/87  soft-tissue]
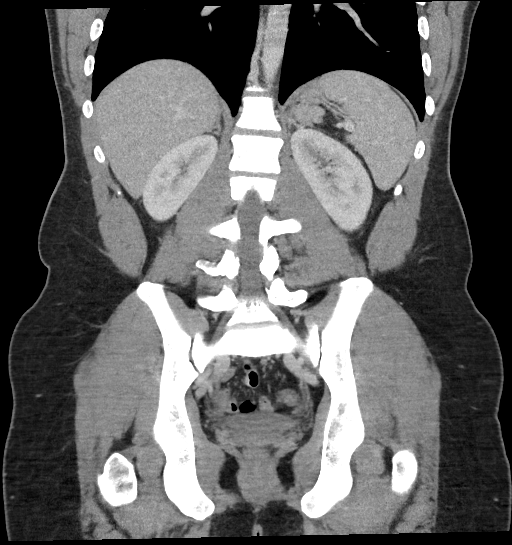

[16 of 46 positions shown; findings below may reference images not displayed]

FINDINGS: Lower chest: The lung bases are clear.  The heart is normal in size.

Hepatobiliary: The liver enhances with no focal abnormality and no
ductal dilatation is seen. No calcified gallstones are noted.

Pancreas: The pancreas is normal in size and the pancreatic duct is
not dilated.

Spleen: The spleen is unremarkable. A small splenule is noted
medially.

Adrenals/Urinary Tract: The adrenal glands appear normal. The
kidneys enhance with no mass or calculus, and no hydronephrosis is
seen. The ureters appear normal in caliber to the urinary bladder.
The urinary bladder is not well distended but no abnormality is
seen.

Stomach/Bowel: The stomach is slightly distended with fluid with no
abnormality noted. No abnormality of small bowel is seen. The colon
is largely decompressed and very difficult to evaluate. Mild edema
of the splenic flexure and transverse colon would be due very
difficult to exclude. Correlate clinically. Few scattered colonic
diverticula are present. The terminal ileum is unremarkable and the
appendix has been resected previously.

Vascular/Lymphatic: The abdominal aorta is normal in caliber. No
adenopathy is seen.

Reproductive: The prostate gland is normal in size for age.

Other: No abdominal wall hernia is noted.

Musculoskeletal: The lumbar vertebrae are in normal alignment with
normal intervertebral disc spaces.
IMPRESSION: 1. No definite explanation for the patient's symptoms is seen. The
colon is very decompressed, and some edema of the mucosa of the
splenic flexure and transverse colon would be difficult to exclude.
Correlate clinically.
2. The terminal ileum is unremarkable.
3. No renal or ureteral calculi are seen.

## 2020-04-14 ENCOUNTER — Ambulatory Visit
Admission: EM | Admit: 2020-04-14 | Discharge: 2020-04-14 | Disposition: A | Discharge status: Home / Self Care | Payer: BLUE CROSS/BLUE SHIELD | Attending: Family Medicine | Admitting: Family Medicine

## 2020-04-14 ENCOUNTER — Ambulatory Visit (INDEPENDENT_AMBULATORY_CARE_PROVIDER_SITE_OTHER): Payer: BLUE CROSS/BLUE SHIELD

## 2020-04-14 ENCOUNTER — Other Ambulatory Visit: Payer: Self-pay

## 2020-04-14 DIAGNOSIS — Z79899 Other long term (current) drug therapy: Secondary | ICD-10-CM | POA: Diagnosis not present

## 2020-04-14 DIAGNOSIS — R0602 Shortness of breath: Secondary | ICD-10-CM | POA: Diagnosis not present

## 2020-04-14 DIAGNOSIS — R059 Cough, unspecified: Secondary | ICD-10-CM

## 2020-04-14 DIAGNOSIS — Z20822 Contact with and (suspected) exposure to covid-19: Secondary | ICD-10-CM | POA: Diagnosis not present

## 2020-04-14 DIAGNOSIS — F17210 Nicotine dependence, cigarettes, uncomplicated: Secondary | ICD-10-CM | POA: Diagnosis not present

## 2020-04-14 DIAGNOSIS — J45909 Unspecified asthma, uncomplicated: Secondary | ICD-10-CM | POA: Diagnosis not present

## 2020-04-14 DIAGNOSIS — J069 Acute upper respiratory infection, unspecified: Secondary | ICD-10-CM

## 2020-04-14 DIAGNOSIS — Z7951 Long term (current) use of inhaled steroids: Secondary | ICD-10-CM | POA: Insufficient documentation

## 2020-04-14 MED ORDER — IPRATROPIUM BROMIDE 0.06 % NA SOLN: 2.0000 | Freq: Four times a day (QID) | NASAL | 12 refills | Status: DC

## 2020-04-14 MED ORDER — AEROCHAMBER MV MISC: 2 refills | Status: AC

## 2020-04-14 MED ORDER — ALBUTEROL SULFATE HFA 108 (90 BASE) MCG/ACT IN AERS: 2.0000 | INHALATION_SPRAY | RESPIRATORY_TRACT | 0 refills | Status: AC | PRN

## 2020-04-14 MED ORDER — PROMETHAZINE-DM 6.25-15 MG/5ML PO SYRP: 5.0000 mL | ORAL_SOLUTION | Freq: Four times a day (QID) | ORAL | 0 refills | Status: DC | PRN

## 2020-04-14 MED ORDER — BENZONATATE 100 MG PO CAPS: 200.0000 mg | ORAL_CAPSULE | Freq: Three times a day (TID) | ORAL | 0 refills | Status: DC

## 2020-04-14 NOTE — ED Provider Notes (Signed)
[facility]    CSN: [id number] Arrival date & time: 04/14/20  1309      History   Chief Complaint Chief Complaint  Patient presents with  . Cough  . Nasal Congestion    HPI Andre Howard is a 25 y.o. male.   HPI   Male here for evaluation of nasal congestion, cough, and body aches x2 days.  Patient reports that he has had clear nasal discharge, productive cough of brown sputum, shortness of breath, wheezing for the last 2 days.  He denies fever, sore throat, or ear pain or pressure.  Patient states that he is a smoker and smokes 1 pack/day and he has for the last 6 years.  Past Medical History:  Diagnosis Date  . Asthma     Patient Active Problem List   Diagnosis Date Noted  . Acute appendicitis with localized peritonitis   . Acute appendicitis 12/02/2016    Past Surgical History:  Procedure Laterality Date  . LAPAROSCOPIC APPENDECTOMY N/A 12/02/2016   Procedure: APPENDECTOMY LAPAROSCOPIC;  Surgeon: Ancil Linsey, MD;  Location: [facility] ORS;  Service: General;  Laterality: N/A;       Home Medications    Prior to Admission medications   Medication Sig Start Date End Date Taking? Authorizing Provider  albuterol (VENTOLIN HFA) 108 (90 Base) MCG/ACT inhaler Inhale 2 puffs into the lungs every 4 (four) hours as needed. 04/14/20  Yes Becky Augusta, NP  benzonatate (TESSALON) 100 MG capsule Take 2 capsules (200 mg total) by mouth every 8 (eight) hours. 04/14/20  Yes Becky Augusta, NP  ipratropium (ATROVENT) 0.06 % nasal spray Place 2 sprays into both nostrils 4 (four) times daily. 04/14/20  Yes Becky Augusta, NP  promethazine-dextromethorphan (PROMETHAZINE-DM) 6.25-15 MG/5ML syrup Take 5 mLs by mouth 4 (four) times daily as needed. 04/14/20  Yes Becky Augusta, NP  Spacer/Aero-Holding Chambers (AEROCHAMBER MV) inhaler Use as instructed 04/14/20  Yes Becky Augusta, NP  budesonide-formoterol [facility]) 160-4.5 MCG/ACT inhaler Inhale 2 puffs daily into the lungs.  03/09/16 04/14/20  [provider]  cetirizine (ZYRTEC) 10 MG tablet Take 1 tablet daily by mouth. 09/08/16 04/14/20  [provider]  montelukast (SINGULAIR) 10 MG tablet Take 1 tablet daily by mouth. 03/09/16 04/14/20  [provider]    Family History No family history on file.  Social History Social History   Tobacco Use  . Smoking status: Current Every Day Smoker    Types: Cigarettes  . Smokeless tobacco: Never Used  Vaping Use  . Vaping Use: Never used  Substance Use Topics  . Alcohol use: No  . Drug use: No     Allergies   Patient has no known allergies.   Review of Systems Review of Systems  Constitutional: Negative for fever.  HENT: Positive for congestion and rhinorrhea. Negative for ear pain.   Respiratory: Positive for cough, shortness of breath and wheezing.   Gastrointestinal: Negative for diarrhea, nausea and vomiting.  Musculoskeletal: Positive for arthralgias and myalgias.  Skin: Negative for rash.  Neurological: Negative for headaches.  Hematological: Negative.   Psychiatric/Behavioral: Negative.      Physical Exam Triage Vital Signs ED Triage Vitals  Enc Vitals Group     BP 04/14/20 1343 117/69     Pulse Rate 04/14/20 1343 93     Resp 04/14/20 1343 18     Temp 04/14/20 1343 98.7 F (37.1 C)     Temp Source 04/14/20 1343 Oral     SpO2 04/14/20 1343  99 %     Weight 04/14/20 1341 220 lb (99.8 kg)     Height 04/14/20 1341 5\' 11"  (1.803 m)     Head Circumference --      Peak Flow --      Pain Score 04/14/20 1341 0     Pain Loc --      Pain Edu? --      Excl. in GC? --    No data found.  Updated Vital Signs BP 117/69 (BP Location: Left Arm)   Pulse 93   Temp 98.7 F (37.1 C) (Oral)   Resp 18   Ht 5\' 11"  (1.803 m)   Wt 220 lb (99.8 kg)   SpO2 99%   BMI 30.68 kg/m   Visual Acuity Right Eye Distance:   Left Eye Distance:   Bilateral Distance:    Right Eye Near:   Left Eye Near:    Bilateral Near:      Physical Exam Vitals and nursing note reviewed.  Constitutional:      General: He is not in acute distress.    Appearance: Normal appearance. He is normal weight.  HENT:     Head: Normocephalic and atraumatic.     Right Ear: Tympanic membrane, ear canal and external ear normal.     Left Ear: Tympanic membrane, ear canal and external ear normal.     Nose: Congestion and rhinorrhea present.     Mouth/Throat:     Mouth: Mucous membranes are moist.     Pharynx: No oropharyngeal exudate.  Cardiovascular:     Rate and Rhythm: Normal rate and regular rhythm.     Pulses: Normal pulses.     Heart sounds: Normal heart sounds. No murmur heard. No gallop.   Pulmonary:     Effort: Pulmonary effort is normal.     Breath sounds: Wheezing and rhonchi present. No rales.  Musculoskeletal:     Cervical back: Normal range of motion and neck supple.  Lymphadenopathy:     Cervical: No cervical adenopathy.  Skin:    General: Skin is warm and dry.     Capillary Refill: Capillary refill takes less than 2 seconds.     Findings: No erythema.  Neurological:     General: No focal deficit present.     Mental Status: He is alert and oriented to person, place, and time.  Psychiatric:        Mood and Affect: Mood normal.        Behavior: Behavior normal.        Thought Content: Thought content normal.        Judgment: Judgment normal.      UC Treatments / Results  Labs (all labs ordered are listed, but only abnormal results are displayed) Labs Reviewed  SARS CORONAVIRUS 2 (TAT 6-24 HRS)    EKG   Radiology DG Chest 2 View  Result Date: 04/14/2020 CLINICAL DATA:  Cough and shortness of breath EXAM: CHEST - 2 VIEW COMPARISON:  November 30, 2006 FINDINGS: Lungs are clear. Heart size and pulmonary vascularity are normal. No adenopathy. No bone lesions. IMPRESSION: Lungs clear.  Heart size normal. Electronically Signed   By: 04/16/2020 III M.D.   On: 04/14/2020 15:14     Procedures Procedures (including critical care time)  Medications Ordered in UC Medications - No data to display  Initial Impression / Assessment and Plan / UC Course  I have reviewed the triage vital signs and the nursing notes.  Pertinent  labs & imaging results that were available during my care of the patient were reviewed by me and considered in my medical decision making (see chart for details).   Patient is a very pleasant 25 year old male here for evaluation of upper and lower respiratory symptoms have been going on for last 2 days.  Patient is a smoker and reports that his sputum is brown in color.  Physical exam reveals erythematous edematous nasal mucosa with clear nasal discharge and copious amounts.  Posterior oropharynx is unremarkable and there is no cervical lymphadenopathy appreciated exam.  Patient has diffuse wheezing in all lung fields and rhonchi bilaterally in the upper lung fields.  Will obtain chest x-ray to evaluate for infiltrate.  Covid swab collected at triage.  EKG interpretation by radiology is that there is no acute intrathoracic process.  Discharge patient home with a diagnosis of viral URI with cough.  Will give patient ipratropium nasal spray, Tessalon Perles, Promethazine DM.  We will also give albuterol inhaler with a spacer to help with wheezing.    Final Clinical Impressions(s) / UC Diagnoses   Final diagnoses:  Viral URI with cough     Discharge Instructions     Isolate at home pending the results of your COVID test.  If you test positive then you will have to quarantine for 5 days from the start of your symptoms.  After 5 days you can break quarantine if your symptoms have improved and you have not had a fever for 24 hours without taking Tylenol or ibuprofen.  Use over-the-counter Tylenol and ibuprofen as needed for body aches and fever.  Use the Tessalon Perles during the day as needed for cough and the Promethazine DM cough syrup at  nighttime as will make you drowsy.  Use the albuterol inhaler with a spacer, 2 puffs every 4-6 hours, as needed for shortness of breath and wheezing.  Use the ipratropium nasal spray, 2 squirts in each nostril every 6 hours, as needed for nasal congestion.  If you develop any increased shortness of breath-especially at rest, you are unable to speak in full sentences, or is a late sign your lips are turning blue you need to go the ER for evaluation.     ED Prescriptions    Medication Sig Dispense Auth. Provider   albuterol (VENTOLIN HFA) 108 (90 Base) MCG/ACT inhaler Inhale 2 puffs into the lungs every 4 (four) hours as needed. 18 g Becky Augusta, NP   Spacer/Aero-Holding Chambers (AEROCHAMBER MV) inhaler Use as instructed 1 each Becky Augusta, NP   benzonatate (TESSALON) 100 MG capsule Take 2 capsules (200 mg total) by mouth every 8 (eight) hours. 21 capsule Becky Augusta, NP   ipratropium (ATROVENT) 0.06 % nasal spray Place 2 sprays into both nostrils 4 (four) times daily. 15 mL Becky Augusta, NP   promethazine-dextromethorphan (PROMETHAZINE-DM) 6.25-15 MG/5ML syrup Take 5 mLs by mouth 4 (four) times daily as needed. 118 mL Becky Augusta, NP     PDMP not reviewed this encounter.   Becky Augusta, NP 04/14/20 [phone]

## 2020-04-14 NOTE — Discharge Instructions (Addendum)
Isolate at home pending the results of your COVID test.  If you test positive then you will have to quarantine for 5 days from the start of your symptoms.  After 5 days you can break quarantine if your symptoms have improved and you have not had a fever for 24 hours without taking Tylenol or ibuprofen.  Use over-the-counter Tylenol and ibuprofen as needed for body aches and fever.  Use the Tessalon Perles during the day as needed for cough and the Promethazine DM cough syrup at nighttime as will make you drowsy.  Use the albuterol inhaler with a spacer, 2 puffs every 4-6 hours, as needed for shortness of breath and wheezing.  Use the ipratropium nasal spray, 2 squirts in each nostril every 6 hours, as needed for nasal congestion.  If you develop any increased shortness of breath-especially at rest, you are unable to speak in full sentences, or is a late sign your lips are turning blue you need to go the ER for evaluation.

## 2020-04-14 NOTE — ED Triage Notes (Signed)
Pt c/o nasal congestion, cough and body aches for 2 days. Pt has taken Sudafed and Dayquil with no real improvement. Pt denies f/n/v/d or other symptoms. Pt would like a COVID test.

## 2020-04-15 LAB — SARS CORONAVIRUS 2 (TAT 6-24 HRS): SARS Coronavirus 2: NEGATIVE

## 2020-11-24 ENCOUNTER — Other Ambulatory Visit: Payer: Self-pay

## 2020-11-24 ENCOUNTER — Ambulatory Visit
Admission: EM | Admit: 2020-11-24 | Discharge: 2020-11-24 | Disposition: A | Discharge status: Home / Self Care | Payer: BLUE CROSS/BLUE SHIELD | Attending: Emergency Medicine | Admitting: Emergency Medicine

## 2020-11-24 ENCOUNTER — Encounter: Payer: Self-pay | Admitting: Emergency Medicine

## 2020-11-24 DIAGNOSIS — Z7951 Long term (current) use of inhaled steroids: Secondary | ICD-10-CM | POA: Diagnosis not present

## 2020-11-24 DIAGNOSIS — F17210 Nicotine dependence, cigarettes, uncomplicated: Secondary | ICD-10-CM | POA: Diagnosis not present

## 2020-11-24 DIAGNOSIS — Z20822 Contact with and (suspected) exposure to covid-19: Secondary | ICD-10-CM | POA: Insufficient documentation

## 2020-11-24 DIAGNOSIS — J069 Acute upper respiratory infection, unspecified: Secondary | ICD-10-CM | POA: Diagnosis present

## 2020-11-24 DIAGNOSIS — Z79899 Other long term (current) drug therapy: Secondary | ICD-10-CM | POA: Insufficient documentation

## 2020-11-24 MED ORDER — BENZONATATE 100 MG PO CAPS: 200.0000 mg | ORAL_CAPSULE | Freq: Three times a day (TID) | ORAL | 0 refills | Status: AC

## 2020-11-24 MED ORDER — IPRATROPIUM BROMIDE 0.06 % NA SOLN: 2.0000 | Freq: Four times a day (QID) | NASAL | 12 refills | Status: AC

## 2020-11-24 MED ORDER — PROMETHAZINE-DM 6.25-15 MG/5ML PO SYRP: 5.0000 mL | ORAL_SOLUTION | Freq: Four times a day (QID) | ORAL | 0 refills | Status: AC | PRN

## 2020-11-24 NOTE — ED Triage Notes (Signed)
Pt presents today with c/o of nasal congestion, sinus pressure and cough x 2 days. Possible low grade fever. No home Covid tests. Treating with Nyquil last evening. He would like to be tested for Covid today.

## 2020-11-24 NOTE — Discharge Instructions (Signed)
Isolate at home pending the results of your COVID test.  If you test positive then you will have to quarantine for 5 days from the start of your symptoms.  After 5 days you can break quarantine if your symptoms have improved and you have not had a fever for 24 hours without taking Tylenol or ibuprofen. ° °Use over-the-counter Tylenol and ibuprofen as needed for body aches and fever. ° °Use the Atrovent nasal spray, 2 squirts in each nostril every 6 hours, as needed for runny nose and postnasal drip. ° °Use the Tessalon Perles every 8 hours during the day.  Take them with a small sip of water.  They may give you some numbness to the base of your tongue or a metallic taste in your mouth, this is normal. ° °Use the Promethazine DM cough syrup at bedtime for cough and congestion.  It will make you drowsy so do not take it during the day. ° °If you develop any increased shortness of breath-especially at rest, you are unable to speak in full sentences, or is a late sign your lips are turning blue you need to go the ER for evaluation.  °

## 2020-11-24 NOTE — ED Provider Notes (Signed)
[facility]    CSN: [id number] Arrival date & time: 11/24/20  1326      History   Chief Complaint Chief Complaint  Patient presents with   Nasal Congestion   Cough   Facial Pain    HPI Andre Howard is a 25 y.o. male.   HPI  25 year old male here for evaluation of respiratory complaints.  Patient reports that for the last 2 days he has been experiencing nasal congestion with sinus pressure, yellow nasal discharge, ear pain, sore throat, and a nonproductive cough.  This has not been associated with any shortness of breath or wheezing or GI complaints.  His foreign did have the flu and was out last week but the patient was not around him.  Patient is requesting testing for COVID today.  Past Medical History:  Diagnosis Date   Asthma     Patient Active Problem List   Diagnosis Date Noted   Acute appendicitis with localized peritonitis    Acute appendicitis 12/02/2016    Past Surgical History:  Procedure Laterality Date   LAPAROSCOPIC APPENDECTOMY N/A 12/02/2016   Procedure: APPENDECTOMY LAPAROSCOPIC;  Surgeon: Ancil Linsey, MD;  Location: [facility] ORS;  Service: General;  Laterality: N/A;       Home Medications    Prior to Admission medications   Medication Sig Start Date End Date Taking? Authorizing Provider  benzonatate (TESSALON) 100 MG capsule Take 2 capsules (200 mg total) by mouth every 8 (eight) hours. 11/24/20  Yes Becky Augusta, NP  ipratropium (ATROVENT) 0.06 % nasal spray Place 2 sprays into both nostrils 4 (four) times daily. 11/24/20  Yes Becky Augusta, NP  promethazine-dextromethorphan (PROMETHAZINE-DM) 6.25-15 MG/5ML syrup Take 5 mLs by mouth 4 (four) times daily as needed. 11/24/20  Yes Becky Augusta, NP  albuterol (VENTOLIN HFA) 108 (90 Base) MCG/ACT inhaler Inhale 2 puffs into the lungs every 4 (four) hours as needed. 04/14/20   Becky Augusta, NP  Spacer/Aero-Holding Deretha Emory (AEROCHAMBER MV) inhaler Use as instructed 04/14/20   Becky Augusta, NP  budesonide-formoterol [facility]) 160-4.5 MCG/ACT inhaler Inhale 2 puffs daily into the lungs. 03/09/16 04/14/20  [provider]  cetirizine (ZYRTEC) 10 MG tablet Take 1 tablet daily by mouth. 09/08/16 04/14/20  [provider]  montelukast (SINGULAIR) 10 MG tablet Take 1 tablet daily by mouth. 03/09/16 04/14/20  [provider]    Family History History reviewed. No pertinent family history.  Social History Social History   Tobacco Use   Smoking status: Every Day    Types: Cigarettes   Smokeless tobacco: Never  Vaping Use   Vaping Use: Never used  Substance Use Topics   Alcohol use: No   Drug use: No     Allergies   Patient has no known allergies.   Review of Systems Review of Systems  Constitutional:  Positive for fever. Negative for activity change and appetite change.  HENT:  Positive for congestion, ear pain, rhinorrhea, sinus pressure and sore throat.   Respiratory:  Positive for cough. Negative for shortness of breath and wheezing.   Gastrointestinal:  Negative for diarrhea, nausea and vomiting.  Skin:  Negative for rash.  Hematological: Negative.     Physical Exam Triage Vital Signs ED Triage Vitals  Enc Vitals Group     BP 11/24/20 1540 114/64     Pulse Rate 11/24/20 1540 91     Resp 11/24/20 1540 18     Temp 11/24/20 1540 98.3 F (36.8 C)  Temp Source 11/24/20 1540 Oral     SpO2 11/24/20 1540 98 %     Weight --      Height --      Head Circumference --      Peak Flow --      Pain Score 11/24/20 1533 7     Pain Loc --      Pain Edu? --      Excl. in GC? --    No data found.  Updated Vital Signs BP 114/64 (BP Location: Right Arm)   Pulse 91   Temp 98.3 F (36.8 C) (Oral)   Resp 18   SpO2 98%   Visual Acuity Right Eye Distance:   Left Eye Distance:   Bilateral Distance:    Right Eye Near:   Left Eye Near:    Bilateral Near:     Physical Exam Vitals and nursing note reviewed.  Constitutional:       General: He is not in acute distress.    Appearance: Normal appearance. He is normal weight. He is not ill-appearing.  HENT:     Head: Normocephalic and atraumatic.     Right Ear: Tympanic membrane, ear canal and external ear normal. There is no impacted cerumen.     Left Ear: Tympanic membrane, ear canal and external ear normal. There is no impacted cerumen.     Nose: Congestion and rhinorrhea present.     Mouth/Throat:     Mouth: Mucous membranes are moist.     Pharynx: Oropharynx is clear. Posterior oropharyngeal erythema present.  Cardiovascular:     Rate and Rhythm: Normal rate and regular rhythm.     Pulses: Normal pulses.     Heart sounds: Normal heart sounds. No murmur heard.   No gallop.  Pulmonary:     Effort: Pulmonary effort is normal.     Breath sounds: Normal breath sounds. No wheezing, rhonchi or rales.  Musculoskeletal:     Cervical back: Normal range of motion and neck supple.  Lymphadenopathy:     Cervical: No cervical adenopathy.  Skin:    General: Skin is warm and dry.     Capillary Refill: Capillary refill takes less than 2 seconds.     Findings: No erythema or rash.  Neurological:     General: No focal deficit present.     Mental Status: He is alert and oriented to person, place, and time.  Psychiatric:        Mood and Affect: Mood normal.        Behavior: Behavior normal.        Thought Content: Thought content normal.        Judgment: Judgment normal.     UC Treatments / Results  Labs (all labs ordered are listed, but only abnormal results are displayed) Labs Reviewed  SARS CORONAVIRUS 2 (TAT 6-24 HRS)    EKG   Radiology No results found.  Procedures Procedures (including critical care time)  Medications Ordered in UC Medications - No data to display  Initial Impression / Assessment and Plan / UC Course  I have reviewed the triage vital signs and the nursing notes.  Pertinent labs & imaging results that were available during my care  of the patient were reviewed by me and considered in my medical decision making (see chart for details).  Patient is a nontoxic-appearing 25 year old male here for evaluation of respiratory complaints as outlined in HPI above.  Patient's physical exam reveals pearly gray tympanic membranes bilaterally  with normal light reflex and clear external auditory canals.  Nasal mucosa is erythematous and edematous with clear nasal discharge.  Oropharyngeal exam reveals mild posterior oropharyngeal erythema with clear postnasal drip.  No cervical lymphadenopathy appreciated on exam.  Cardiopulmonary exam reveals clear lung sounds in all fields.  Will swab patient for COVID and discharged home to isolate pending the results.  I will prescribe Atrovent nasal spray to help with his nasal congestion and Tessalon Perles and Promethazine DM cough syrup to help with cough and congestion.  Work note provided.   Final Clinical Impressions(s) / UC Diagnoses   Final diagnoses:  Viral URI with cough     Discharge Instructions      Isolate at home pending the results of your COVID test.  If you test positive then you will have to quarantine for 5 days from the start of your symptoms.  After 5 days you can break quarantine if your symptoms have improved and you have not had a fever for 24 hours without taking Tylenol or ibuprofen.  Use over-the-counter Tylenol and ibuprofen as needed for body aches and fever.  Use the Atrovent nasal spray, 2 squirts in each nostril every 6 hours, as needed for runny nose and postnasal drip.  Use the Tessalon Perles every 8 hours during the day.  Take them with a small sip of water.  They may give you some numbness to the base of your tongue or a metallic taste in your mouth, this is normal.  Use the Promethazine DM cough syrup at bedtime for cough and congestion.  It will make you drowsy so do not take it during the day.  If you develop any increased shortness of breath-especially  at rest, you are unable to speak in full sentences, or is a late sign your lips are turning blue you need to go the ER for evaluation.      ED Prescriptions     Medication Sig Dispense Auth. Provider   benzonatate (TESSALON) 100 MG capsule Take 2 capsules (200 mg total) by mouth every 8 (eight) hours. 21 capsule Becky Augusta, NP   ipratropium (ATROVENT) 0.06 % nasal spray Place 2 sprays into both nostrils 4 (four) times daily. 15 mL Becky Augusta, NP   promethazine-dextromethorphan (PROMETHAZINE-DM) 6.25-15 MG/5ML syrup Take 5 mLs by mouth 4 (four) times daily as needed. 118 mL Becky Augusta, NP      PDMP not reviewed this encounter.   Becky Augusta, NP 11/24/20 [phone]

## 2020-11-25 LAB — SARS CORONAVIRUS 2 (TAT 6-24 HRS): SARS Coronavirus 2: NEGATIVE

## 2021-08-25 IMAGING — CR DG CHEST 2V
2 series · 2 of 2 positions shown · non-contrast
Comparison: November 30, 2006

CLINICAL DATA: Cough and shortness of breath

EXAM:
CHEST - 2 VIEW

[chest pa]
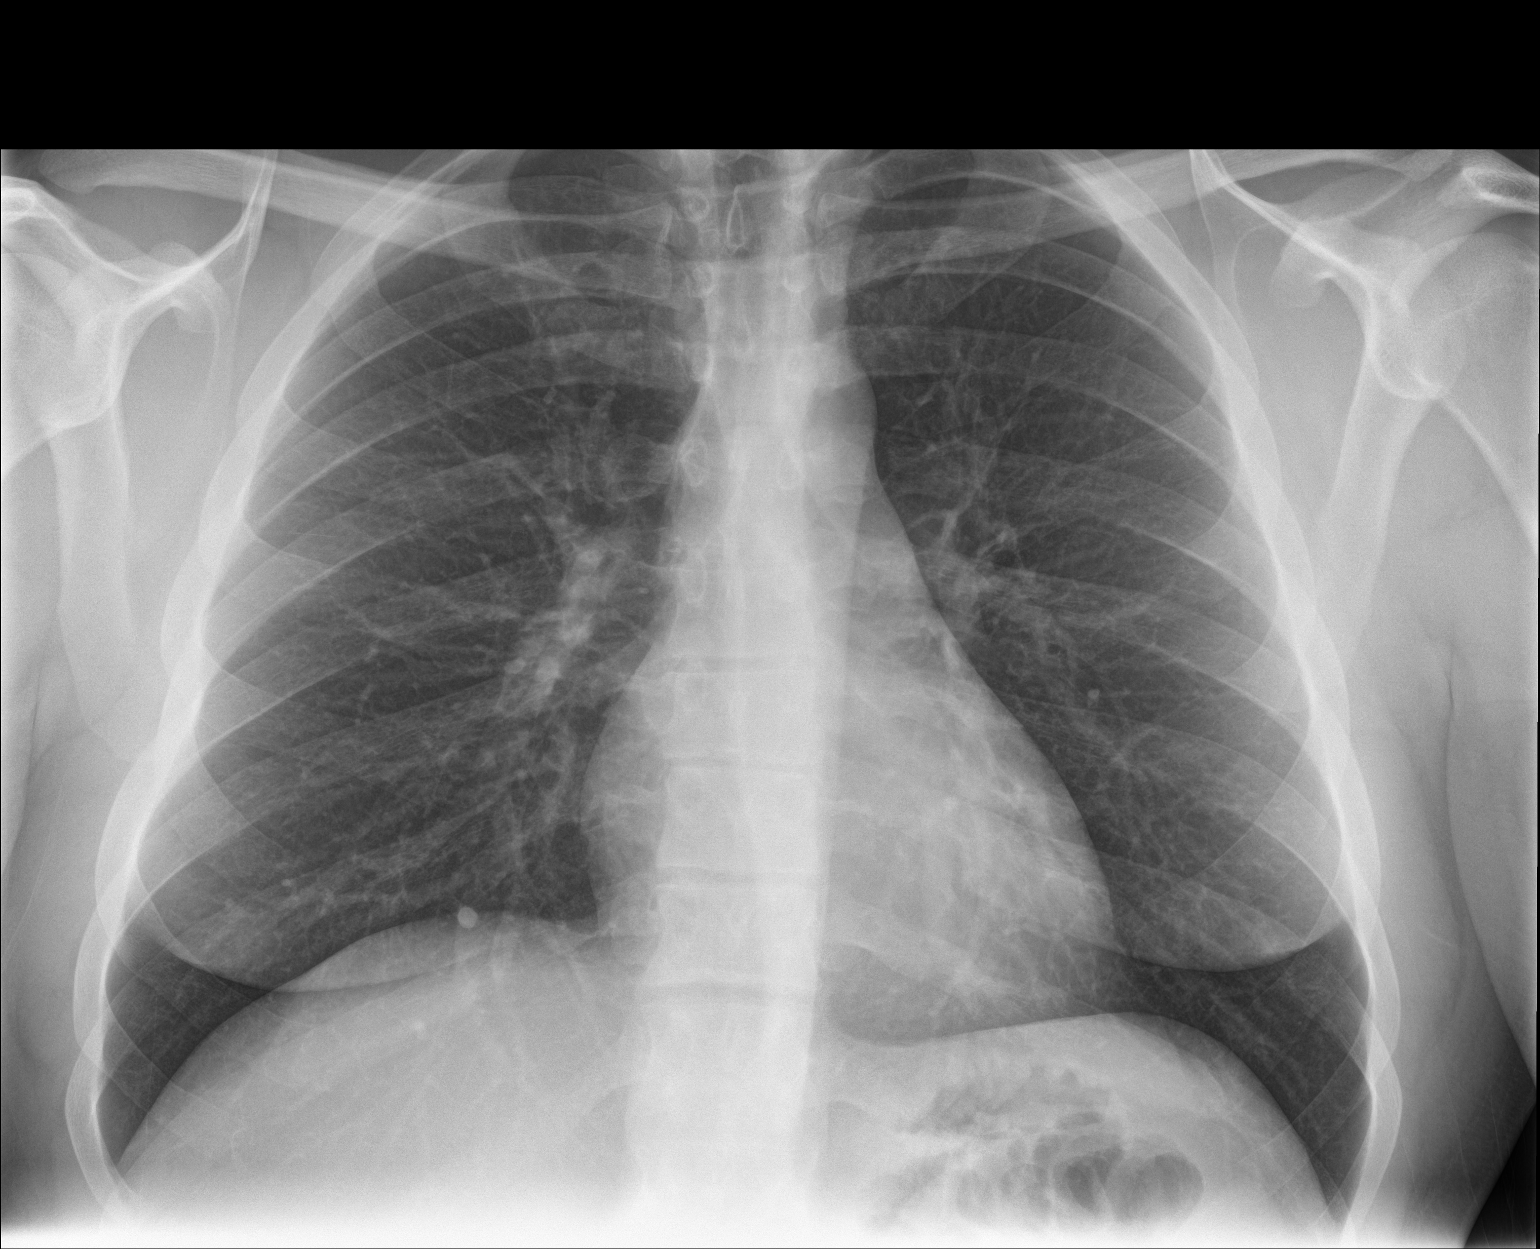

[chest lat]
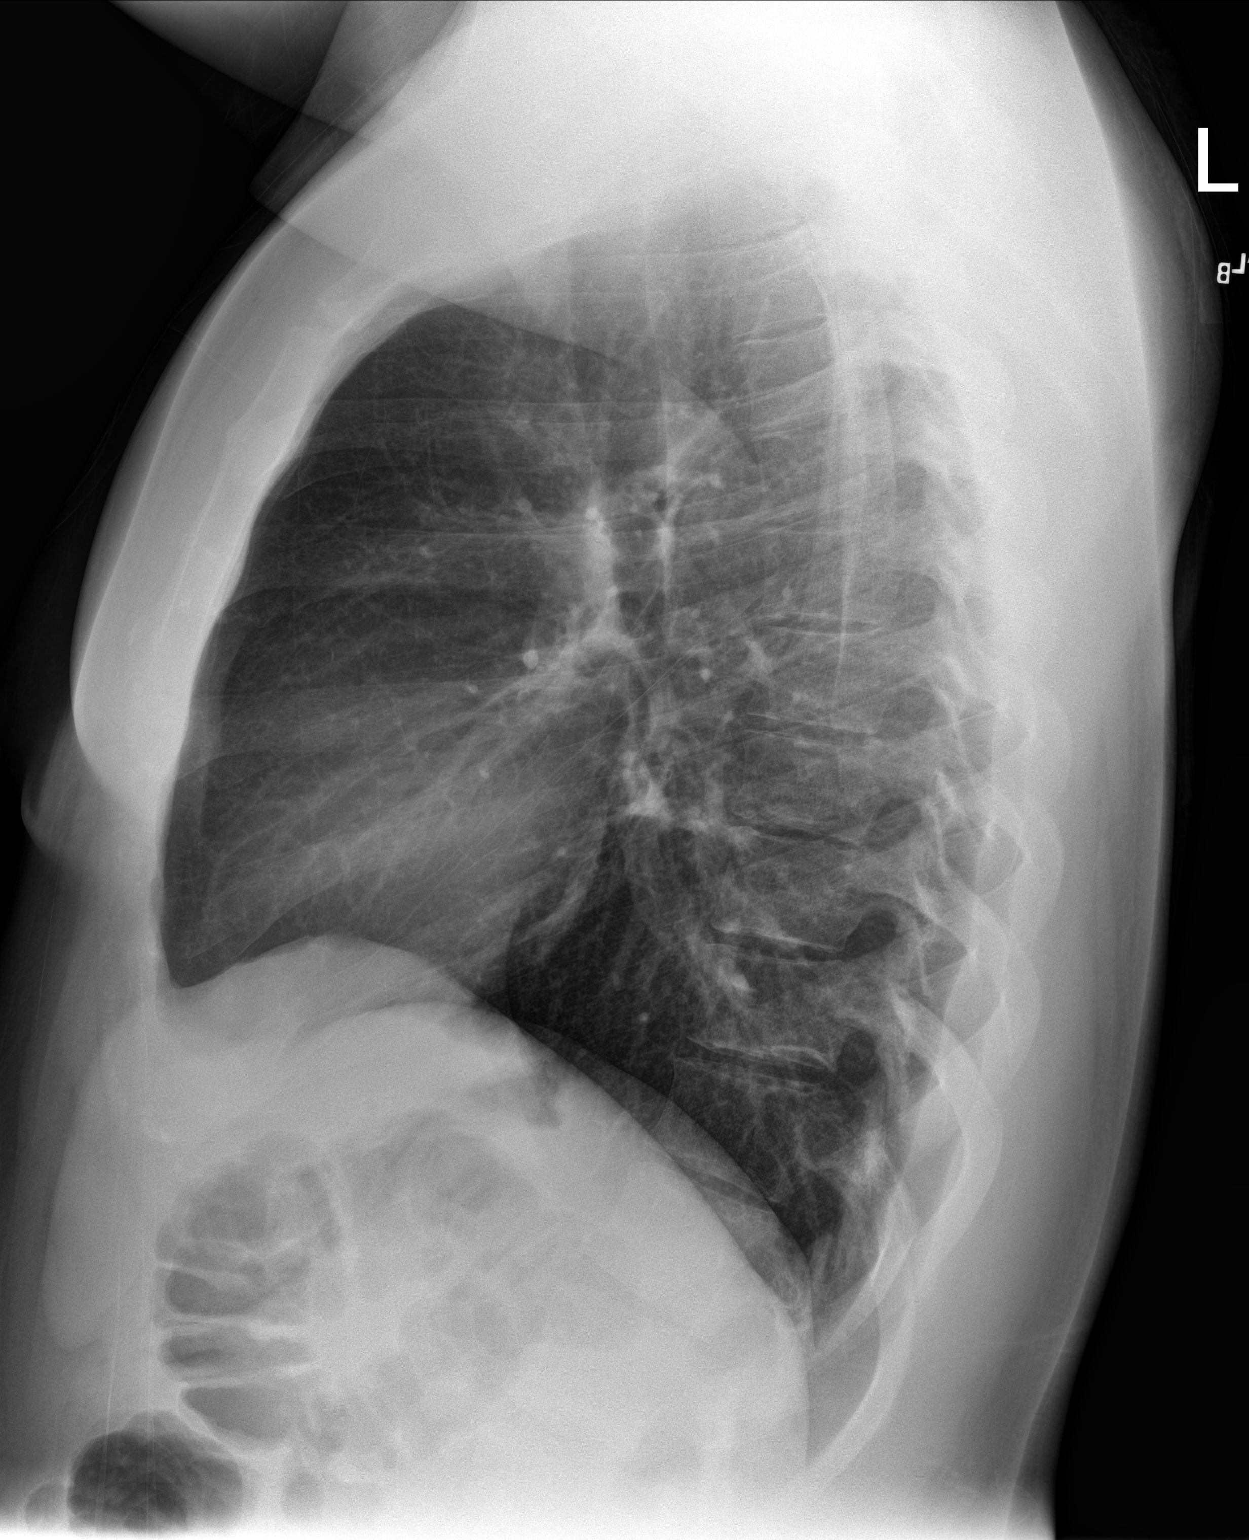

[2 of 2 positions shown; findings below may reference images not displayed]

FINDINGS: Lungs are clear. Heart size and pulmonary vascularity are normal. No
adenopathy. No bone lesions.
IMPRESSION: Lungs clear.  Heart size normal.

## 2022-09-11 ENCOUNTER — Emergency Department: Payer: BC Managed Care – PPO

## 2022-09-11 ENCOUNTER — Emergency Department
Admission: EM | Admit: 2022-09-11 | Discharge: 2022-09-11 | Disposition: A | Discharge status: Home / Self Care | Payer: BC Managed Care – PPO | Attending: Emergency Medicine | Admitting: Emergency Medicine

## 2022-09-11 ENCOUNTER — Other Ambulatory Visit: Payer: Self-pay

## 2022-09-11 DIAGNOSIS — S39012A Strain of muscle, fascia and tendon of lower back, initial encounter: Secondary | ICD-10-CM | POA: Diagnosis not present

## 2022-09-11 DIAGNOSIS — X500XXA Overexertion from strenuous movement or load, initial encounter: Secondary | ICD-10-CM | POA: Insufficient documentation

## 2022-09-11 DIAGNOSIS — S3992XA Unspecified injury of lower back, initial encounter: Secondary | ICD-10-CM | POA: Diagnosis present

## 2022-09-11 MED ORDER — METHOCARBAMOL 500 MG PO TABS
1000.0000 mg | ORAL_TABLET | Freq: Once | ORAL | Status: AC
  Administered 2022-09-11: 1000 mg via ORAL
  Filled 2022-09-11: qty 2

## 2022-09-11 MED ORDER — DEXAMETHASONE 4 MG PO TABS
10.0000 mg | ORAL_TABLET | Freq: Once | ORAL | Status: AC
  Administered 2022-09-11: 10 mg via ORAL
  Filled 2022-09-11: qty 3

## 2022-09-11 MED ORDER — LIDOCAINE 5 % EX PTCH: 1.0000 | MEDICATED_PATCH | Freq: Two times a day (BID) | CUTANEOUS | 0 refills | Status: AC

## 2022-09-11 MED ORDER — LIDOCAINE 5 % EX PTCH
1.0000 | MEDICATED_PATCH | Freq: Once | CUTANEOUS | Status: DC
  Administered 2022-09-11: 1 via TRANSDERMAL
  Filled 2022-09-11: qty 1

## 2022-09-11 MED ORDER — KETOROLAC TROMETHAMINE 60 MG/2ML IM SOLN
30.0000 mg | Freq: Once | INTRAMUSCULAR | Status: AC
  Administered 2022-09-11: 30 mg via INTRAMUSCULAR
  Filled 2022-09-11: qty 2

## 2022-09-11 MED ORDER — METHOCARBAMOL 500 MG PO TABS: 500.0000 mg | ORAL_TABLET | Freq: Three times a day (TID) | ORAL | 0 refills | Status: AC | PRN

## 2022-09-11 NOTE — ED Triage Notes (Signed)
Pt presents to ED with c/o of lower back pain that started yesterday after moving his car. Pt denies incontinence. Pt ambulatory to registration desk. NAD noted.

## 2022-09-11 NOTE — Discharge Instructions (Signed)
You were seen in the emergency department today for your low back pain.  I suspect this is likely muscular strain of the paraspinal muscles.  There may be some component of sciatica or slight pinched nerve on the left side as well.  Please follow-up with orthopedics if symptoms continue past 2 weeks.  You can take ibuprofen 600 mg 3 times a day for the next 5 days and then take as needed.  You can also take Tylenol 650 mg every 4 hours as needed.  I have sent a muscle relaxer as well as a lidocaine patch to your pharmacy to pick up and take as needed for the next several days.  Continue using heating pad at home as well.

## 2022-09-11 NOTE — ED Provider Notes (Signed)
[facility] Provider Note    Event Date/Time   First MD Initiated Contact with Patient 09/11/22 1102     (approximate)   History   Back Pain   HPI Andre Howard is a 27 y.o. male presenting today for low back pain.  Patient states he was moving his car yesterday by pushing it when he noticed acute onset of lower back pain.  He has been taking Aleve and heating pad to it since yesterday with some relief.  He denies any numbness or weakness in his lower extremities.  He denies any loss of sensation in his pelvic area.  He denies any loss of bowel or bladder.  Has still been able to ambulate on it.  Denies any direct trauma to the area.     Physical Exam   Triage Vital Signs: ED Triage Vitals  Encounter Vitals Group     BP 09/11/22 1040 125/60     Systolic BP Percentile --      Diastolic BP Percentile --      Pulse Rate 09/11/22 1040 68     Resp 09/11/22 1040 16     Temp --      Temp src --      SpO2 09/11/22 1040 98 %     Weight --      Height --      Head Circumference --      Peak Flow --      Pain Score 09/11/22 1038 10     Pain Loc --      Pain Education --      Exclude from Growth Chart --     Most recent vital signs: Vitals:   09/11/22 1040  BP: 125/60  Pulse: 68  Resp: 16  SpO2: 98%    I have reviewed the vital signs. General:  Awake, alert, no acute distress. Head:  Normocephalic, Atraumatic. EENT:  PERRL, EOMI, Oral mucosa pink and moist, Neck is supple. Cardiovascular: Regular rate, 2+ distal pulses. Respiratory:  Normal respiratory effort, symmetrical expansion, no distress.   Extremities:  Moving all four extremities through full ROM without pain.  Mild tenderness palpation bilateral lumbar paraspinal muscles.  Positive straight leg raise test on left side. Neuro:  Alert and oriented.  Interacting appropriately.  No numbness or loss of sensation in bilateral lower extremities. Skin:  Warm, dry, no rash.   Psych:  Appropriate affect.     ED Results / Procedures / Treatments   Labs (all labs ordered are listed, but only abnormal results are displayed) Labs Reviewed - No data to display   EKG    RADIOLOGY See ED course for my independent interpretation.   PROCEDURES:  Critical Care performed: No  Procedures   MEDICATIONS ORDERED IN ED: Medications  lidocaine (LIDODERM) 5 % 1 patch (1 patch Transdermal Patch Applied 09/11/22 1134)  ketorolac (TORADOL) injection 30 mg (30 mg Intramuscular Given 09/11/22 1133)  dexamethasone (DECADRON) tablet 10 mg (10 mg Oral Given 09/11/22 1133)  methocarbamol (ROBAXIN) tablet 1,000 mg (1,000 mg Oral Given 09/11/22 1133)     IMPRESSION / MDM / ASSESSMENT AND PLAN / ED COURSE  I reviewed the triage vital signs and the nursing notes.                              Differential diagnosis includes, but is not limited to, lumbar paraspinal muscle strain, herniated disc, sciatica.  Patient's presentation is most consistent with acute complicated illness / injury requiring diagnostic workup.  Patient is a 27 year old male presenting today for bilateral lower back pain following muscular strain.  Symptoms are most consistent with lumbar paraspinal muscle strain with some concern for possible sciatica on the left side.  Otherwise, has no red flag symptoms for worsening injury.  Patient treated with Toradol, Lidoderm, Robaxin, and Decadron.  Patient stable for discharge and outpatient follow-up with orthopedics as needed. Clinical Course as of 09/11/22 1211  Sat Sep 11, 2022  1149 DG Lumbar Spine 2-3 Views I independently reviewed and interpreted lumbar spine x-rays: No acute osseous injuries of the lumbar spine [DW]    Clinical Course User Index [DW] Janith Lima, MD     FINAL CLINICAL IMPRESSION(S) / ED DIAGNOSES   Final diagnoses:  Strain of lumbar region, initial encounter     Rx / DC Orders   ED Discharge Orders          Ordered     methocarbamol (ROBAXIN) 500 MG tablet  Every 8 hours PRN        09/11/22 1211    lidocaine (LIDODERM) 5 %  Every 12 hours        09/11/22 1211             Note:  This document was prepared using Dragon voice recognition software and may include unintentional dictation errors.   Janith Lima, MD 09/11/22 [phone]
